# Patient Record
Sex: Female | Born: 1980 | Race: Black or African American | Hispanic: No | Marital: Single | Smoking: Former smoker
Health system: Southern US, Community
[De-identification: ages and names within clinical notes are randomized; demographics above are authoritative.]

## PROBLEM LIST (undated history)

## (undated) DIAGNOSIS — K760 Fatty (change of) liver, not elsewhere classified: Secondary | ICD-10-CM

## (undated) DIAGNOSIS — K8020 Calculus of gallbladder without cholecystitis without obstruction: Secondary | ICD-10-CM

## (undated) HISTORY — DX: Calculus of gallbladder without cholecystitis without obstruction: K80.20

## (undated) HISTORY — DX: Fatty (change of) liver, not elsewhere classified: K76.0

---
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
## 2003-07-05 ENCOUNTER — Encounter: Admission: RE | Admit: 2003-07-05 | Discharge: 2003-07-05 | Payer: Self-pay | Admitting: Family Medicine

## 2005-01-14 ENCOUNTER — Emergency Department (HOSPITAL_COMMUNITY): Admission: EM | Admit: 2005-01-14 | Discharge: 2005-01-14 | Payer: Self-pay | Admitting: Family Medicine

## 2008-09-13 ENCOUNTER — Emergency Department (HOSPITAL_COMMUNITY): Admission: EM | Admit: 2008-09-13 | Discharge: 2008-09-13 | Payer: Self-pay | Admitting: Emergency Medicine

## 2008-09-14 ENCOUNTER — Emergency Department (HOSPITAL_COMMUNITY): Admission: EM | Admit: 2008-09-14 | Discharge: 2008-09-14 | Payer: Self-pay | Admitting: Family Medicine

## 2008-09-25 ENCOUNTER — Emergency Department (HOSPITAL_COMMUNITY): Admission: EM | Admit: 2008-09-25 | Discharge: 2008-09-25 | Payer: Self-pay | Admitting: Family Medicine

## 2010-04-12 LAB — POCT URINALYSIS DIP (DEVICE)
Glucose, UA: NEGATIVE mg/dL
Hgb urine dipstick: NEGATIVE
Nitrite: NEGATIVE
pH: 6.5 (ref 5.0–8.0)

## 2010-04-12 LAB — URINE MICROSCOPIC-ADD ON

## 2010-04-12 LAB — URINALYSIS, ROUTINE W REFLEX MICROSCOPIC
Ketones, ur: NEGATIVE mg/dL
Protein, ur: NEGATIVE mg/dL
Urobilinogen, UA: 2 mg/dL — ABNORMAL HIGH (ref 0.0–1.0)

## 2010-04-12 LAB — GC/CHLAMYDIA PROBE AMP, GENITAL: GC Probe Amp, Genital: POSITIVE — AB

## 2010-04-12 LAB — URINE CULTURE
Colony Count: NO GROWTH
Culture: NO GROWTH

## 2010-04-12 LAB — WET PREP, GENITAL: Trich, Wet Prep: NONE SEEN

## 2013-12-23 ENCOUNTER — Other Ambulatory Visit (INDEPENDENT_AMBULATORY_CARE_PROVIDER_SITE_OTHER): Payer: 59

## 2013-12-23 ENCOUNTER — Encounter: Payer: Self-pay | Admitting: Internal Medicine

## 2013-12-23 ENCOUNTER — Ambulatory Visit (INDEPENDENT_AMBULATORY_CARE_PROVIDER_SITE_OTHER): Payer: 59 | Admitting: Internal Medicine

## 2013-12-23 VITALS — BP 122/84 | HR 91 | Temp 98.5°F | Resp 16 | Ht 64.0 in | Wt 262.2 lb

## 2013-12-23 DIAGNOSIS — Z23 Encounter for immunization: Secondary | ICD-10-CM

## 2013-12-23 DIAGNOSIS — Z720 Tobacco use: Secondary | ICD-10-CM

## 2013-12-23 DIAGNOSIS — Z299 Encounter for prophylactic measures, unspecified: Secondary | ICD-10-CM

## 2013-12-23 DIAGNOSIS — Z418 Encounter for other procedures for purposes other than remedying health state: Secondary | ICD-10-CM

## 2013-12-23 DIAGNOSIS — Z0000 Encounter for general adult medical examination without abnormal findings: Secondary | ICD-10-CM

## 2013-12-23 LAB — BASIC METABOLIC PANEL
BUN: 16 mg/dL (ref 6–23)
CO2: 25 mEq/L (ref 19–32)
Calcium: 9.3 mg/dL (ref 8.4–10.5)
Chloride: 104 mEq/L (ref 96–112)
Creatinine, Ser: 0.8 mg/dL (ref 0.4–1.2)
GFR: 109 mL/min (ref >60.00)
Glucose, Bld: 99 mg/dL (ref 70–99)
POTASSIUM: 4.5 meq/L (ref 3.5–5.1)
SODIUM: 136 meq/L (ref 135–145)

## 2013-12-23 LAB — CBC
HCT: 40.2 % (ref 36.0–46.0)
HEMOGLOBIN: 13 g/dL (ref 12.0–15.0)
MCHC: 32.3 g/dL (ref 30.0–36.0)
MCV: 87.2 fl (ref 78.0–100.0)
PLATELETS: 281 10*3/uL (ref 150.0–400.0)
RBC: 4.61 Mil/uL (ref 3.87–5.11)
RDW: 13.7 % (ref 11.5–15.5)
WBC: 8.5 10*3/uL (ref 4.0–10.5)

## 2013-12-23 LAB — LIPID PANEL
CHOL/HDL RATIO: 3
Cholesterol: 167 mg/dL (ref 0–200)
HDL: 49.3 mg/dL (ref >39.00)
LDL Cholesterol: 103 mg/dL — ABNORMAL HIGH (ref 0–99)
NONHDL: 117.7
Triglycerides: 73 mg/dL (ref 0.0–149.0)
VLDL: 14.6 mg/dL (ref 0.0–40.0)

## 2013-12-23 LAB — HEMOGLOBIN A1C: Hgb A1c MFr Bld: 5.9 % (ref 4.6–6.5)

## 2013-12-23 LAB — HIV ANTIBODY (ROUTINE TESTING W REFLEX): HIV 1&2 Ab, 4th Generation: NONREACTIVE

## 2013-12-23 NOTE — Assessment & Plan Note (Signed)
Spoke with her about the risks and harms of cigarette smoke and talked with her about quitting. Spoke with her about various methods including patch, gum, bupropion and given information about quitting. She is moving from pre-contemplative to preparing herself to quit.

## 2013-12-23 NOTE — Assessment & Plan Note (Signed)
Patient will think about whether she wants to do her pap smear here or with a gyn. She got TDap today. Declines flu shot.

## 2013-12-23 NOTE — Progress Notes (Signed)
Pre visit review using our clinic review tool, if applicable. No additional management support is needed unless otherwise documented below in the visit note. 

## 2013-12-23 NOTE — Patient Instructions (Signed)
We do recommend that you work on getting exercise 3-4 days a week for 20-30 minutes each time.  We will check your blood work today and call you back with the results. We have given you a tetanus shot today.  You are also due for a Pap smear. We can do this at our office or you can find a gynecologist that is in your network to do that.   Quitting smoking is one of the best things that you can do for your health. There are a variety of options to help you quit including patches, gums, prescription medications. We will give you some information about quitting today and if you decide that you want to try a prescription medication to help you quit just call our office..  We will see you back next year for your checkup. If you have any new problems or questions before then please feel free to call our office.  Smoking Cessation Quitting smoking is important to your health and has many advantages. However, it is not always easy to quit since nicotine is a very addictive drug. Oftentimes, people try 3 times or more before being able to quit. This document explains the best ways for you to prepare to quit smoking. Quitting takes hard work and a lot of effort, but you can do it. ADVANTAGES OF QUITTING SMOKING  You will live longer, feel better, and live better.  Your body will feel the impact of quitting smoking almost immediately.  Within 20 minutes, blood pressure decreases. Your pulse returns to its normal level.  After 8 hours, carbon monoxide levels in the blood return to normal. Your oxygen level increases.  After 24 hours, the chance of having a heart attack starts to decrease. Your breath, hair, and body stop smelling like smoke.  After 48 hours, damaged nerve endings begin to recover. Your sense of taste and smell improve.  After 72 hours, the body is virtually free of nicotine. Your bronchial tubes relax and breathing becomes easier.  After 2 to 12 weeks, lungs can hold more air.  Exercise becomes easier and circulation improves.  The risk of having a heart attack, stroke, cancer, or lung disease is greatly reduced.  After 1 year, the risk of coronary heart disease is cut in half.  After 5 years, the risk of stroke falls to the same as a nonsmoker.  After 10 years, the risk of lung cancer is cut in half and the risk of other cancers decreases significantly.  After 15 years, the risk of coronary heart disease drops, usually to the level of a nonsmoker.  If you are pregnant, quitting smoking will improve your chances of having a healthy baby.  The people you live with, especially any children, will be healthier.  You will have extra money to spend on things other than cigarettes. QUESTIONS TO THINK ABOUT BEFORE ATTEMPTING TO QUIT You may want to talk about your answers with your health care provider.  Why do you want to quit?  If you tried to quit in the past, what helped and what did not?  What will be the most difficult situations for you after you quit? How will you plan to handle them?  Who can help you through the tough times? Your family? Friends? A health care provider?  What pleasures do you get from smoking? What ways can you still get pleasure if you quit? Here are some questions to ask your health care provider:  How can you help me  to be successful at quitting?  What medicine do you think would be best for me and how should I take it?  What should I do if I need more help?  What is smoking withdrawal like? How can I get information on withdrawal? GET READY  Set a quit date.  Change your environment by getting rid of all cigarettes, ashtrays, matches, and lighters in your home, car, or work. Do not let people smoke in your home.  Review your past attempts to quit. Think about what worked and what did not. GET SUPPORT AND ENCOURAGEMENT You have a better chance of being successful if you have help. You can get support in many ways.  Tell  your family, friends, and coworkers that you are going to quit and need their support. Ask them not to smoke around you.  Get individual, group, or telephone counseling and support. Programs are available at [employer] and health centers. Call your local health department for information about programs in your area.  Spiritual beliefs and practices may help some smokers quit.  Download a "quit meter" on your computer to keep track of quit statistics, such as how long you have gone without smoking, cigarettes not smoked, and money saved.  Get a self-help book about quitting smoking and staying off tobacco. LEARN NEW SKILLS AND BEHAVIORS  Distract yourself from urges to smoke. Talk to someone, go for a walk, or occupy your time with a task.  Change your normal routine. Take a different route to work. Drink tea instead of coffee. Eat breakfast in a different place.  Reduce your stress. Take a hot bath, exercise, or read a book.  Plan something enjoyable to do every day. Reward yourself for not smoking.  Explore interactive web-based programs that specialize in helping you quit. GET MEDICINE AND USE IT CORRECTLY Medicines can help you stop smoking and decrease the urge to smoke. Combining medicine with the above behavioral methods and support can greatly increase your chances of successfully quitting smoking.  Nicotine replacement therapy helps deliver nicotine to your body without the negative effects and risks of smoking. Nicotine replacement therapy includes nicotine gum, lozenges, inhalers, nasal sprays, and skin patches. Some may be available over-the-counter and others require a prescription.  Antidepressant medicine helps people abstain from smoking, but how this works is unknown. This medicine is available by prescription.  Nicotinic receptor partial agonist medicine simulates the effect of nicotine in your brain. This medicine is available by prescription. Ask your health care  provider for advice about which medicines to use and how to use them based on your health history. Your health care provider will tell you what side effects to look out for if you choose to be on a medicine or therapy. Carefully read the information on the package. Do not use any other product containing nicotine while using a nicotine replacement product.  RELAPSE OR DIFFICULT SITUATIONS Most relapses occur within the first 3 months after quitting. Do not be discouraged if you start smoking again. Remember, most people try several times before finally quitting. You may have symptoms of withdrawal because your body is used to nicotine. You may crave cigarettes, be irritable, feel very hungry, cough often, get headaches, or have difficulty concentrating. The withdrawal symptoms are only temporary. They are strongest when you first quit, but they will go away within 10-14 days. To reduce the chances of relapse, try to:  Avoid drinking alcohol. Drinking lowers your chances of successfully quitting.  Reduce the  amount of caffeine you consume. Once you quit smoking, the amount of caffeine in your body increases and can give you symptoms, such as a rapid heartbeat, sweating, and anxiety.  Avoid smokers because they can make you want to smoke.  Do not let weight gain distract you. Many smokers will gain weight when they quit, usually less than 10 pounds. Eat a healthy diet and stay active. You can always lose the weight gained after you quit.  Find ways to improve your mood other than smoking. FOR MORE INFORMATION  www.smokefree.gov  Document Released: 12/17/2000 Document Revised: 05/09/2013 Document Reviewed: 04/03/2011 [facility] Patient Information 2015 [place] [place]. This information is not intended to replace advice given to you by your health care provider. Make sure you discuss any questions you have with your health care provider.   Bupropion sustained-release tablets (smoking cessation) What  is this medicine? BUPROPION (byoo PROE pee on) is used to help people quit smoking. This medicine may be used for other purposes; ask your health care provider or pharmacist if you have questions. COMMON BRAND NAME(S): Buproban, Zyban How should I use this medicine? Take this medicine by mouth with a glass of water. Follow the directions on the prescription label. You can take it with or without food. If it upsets your stomach, take it with food. Do not cut, crush or chew this medicine. Take your medicine at regular intervals. If you take this medicine more than once a day, take your second dose at least 8 hours after you take your first dose. To limit difficulty in sleeping, avoid taking this medicine at bedtime. Do not take your medicine more often than directed. Do not stop taking this medicine suddenly except upon the advice of your doctor. Stopping this medicine too quickly may cause serious side effects. A special MedGuide will be given to you by the pharmacist with each prescription and refill. Be sure to read this information carefully each time. Talk to your pediatrician regarding the use of this medicine in children. Special care may be needed. Overdosage: If you think you have taken too much of this medicine contact a poison control center or emergency room at once. NOTE: This medicine is only for you. Do not share this medicine with others. What if I miss a dose? If you miss a dose, skip the missed dose and take your next tablet at the regular time. There should be at least 8 hours between doses. Do not take double or extra doses. What may interact with this medicine? Do not take this medicine with any of the following medications: -linezolid -MAOIs like Azilect, Carbex, Eldepryl, Marplan, Nardil, and Parnate -methylene blue (injected into a vein) -other medicines that contain bupropion like Wellbutrin This medicine may also interact with the following medications: -alcohol -certain  medicines for anxiety or sleep -certain medicines for blood pressure like metoprolol, propranolol -certain medicines for depression or psychotic disturbances -certain medicines for HIV or AIDS like efavirenz, lopinavir, nelfinavir, ritonavir -certain medicines for irregular heart beat like propafenone, flecainide -certain medicines for Parkinson's disease like amantadine, levodopa -certain medicines for seizures like carbamazepine, phenytoin, phenobarbital -cimetidine -clopidogrel -cyclophosphamide -furazolidone -isoniazid -nicotine -orphenadrine -procarbazine -steroid medicines like prednisone or cortisone -stimulant medicines for attention disorders, weight loss, or to stay awake -tamoxifen -theophylline -thiotepa -ticlopidine -tramadol -warfarin This list may not describe all possible interactions. Give your health care provider a list of all the medicines, herbs, non-prescription drugs, or dietary supplements you use. Also tell them if you smoke, drink  alcohol, or use illegal drugs. Some items may interact with your medicine. What should I watch for while using this medicine? Visit your doctor or health care professional for regular checks on your progress. This medicine should be used together with a patient support program. It is important to participate in a behavioral program, counseling, or other support program that is recommended by your health care professional. Patients and their families should watch out for new or worsening thoughts of suicide or depression. Also watch out for sudden changes in feelings such as feeling anxious, agitated, panicky, irritable, hostile, aggressive, impulsive, severely restless, overly excited and hyperactive, or not being able to sleep. If this happens, especially at the beginning of treatment or after a change in dose, call your health care professional. Avoid alcoholic drinks while taking this medicine. Drinking excessive alcoholic beverages,  using sleeping or anxiety medicines, or quickly stopping the use of these agents while taking this medicine may increase your risk for a seizure. Do not drive or use heavy machinery until you know how this medicine affects you. This medicine can impair your ability to perform these tasks. Do not take this medicine close to bedtime. It may prevent you from sleeping. Your mouth may get dry. Chewing sugarless gum or sucking hard candy, and drinking plenty of water may help. Contact your doctor if the problem does not go away or is severe. Do not use nicotine patches or chewing gum without the advice of your doctor or health care professional while taking this medicine. You may need to have your blood pressure taken regularly if your doctor recommends that you use both nicotine and this medicine together. What side effects may I notice from receiving this medicine? Side effects that you should report to your doctor or health care professional as soon as possible: -allergic reactions like skin rash, itching or hives, swelling of the face, lips, or tongue -breathing problems -changes in vision -confusion -fast or irregular heartbeat -hallucinations -increased blood pressure -redness, blistering, peeling or loosening of the skin, including inside the mouth -seizures -suicidal thoughts or other mood changes -unusually weak or tired -vomiting Side effects that usually do not require medical attention (report to your doctor or health care professional if they continue or are bothersome): -change in sex drive or performance -constipation -headache -loss of appetite -nausea -tremors -weight loss This list may not describe all possible side effects. Call your doctor for medical advice about side effects. You may report side effects to FDA at 1-800-FDA-1088. Where should I keep my medicine? Keep out of the reach of children. Store at room temperature between 20 and 25 degrees C (68 and 77 degrees F).  Protect from light. Keep container tightly closed. Throw away any unused medicine after the expiration date. NOTE: This sheet is a summary. It may not cover all possible information. If you have questions about this medicine, talk to your doctor, pharmacist, or health care provider.  2015, [employer]. (2012-08-20 10:55:10)

## 2013-12-23 NOTE — Assessment & Plan Note (Signed)
Spoke with her about diet and exercise. Reminded her that being overweight can cause health problems including high blood pressure, diabetes. Advised her that she needs to be exercising at least 3-4 times per week.

## 2013-12-23 NOTE — Progress Notes (Signed)
   Subjective:    Patient ID: Angela Crosby, female    DOB: 1980-01-29, 33 y.o.   MRN: [id number]  HPI  the patient is a 33 year old female who comes in today to establish care. She does have morbid obesity, tobacco abuse. She is currently smoking about half pack per day. She has done so for several years. She states that she did quit at one time however smoking is a social activity at her work and this caused her to restart. She does not have any complaints at this time nor any concerns. She is not really getting any exercise right now except one day week she walks. Her diet is so-so however she states it could be better. She is just concerned since there are several health problems that run in her family she wants to make sure that she does not have these health problems  Review of Systems  Constitutional: Negative for fever, activity change, appetite change, fatigue and unexpected weight change.  HENT: Negative.   Respiratory: Negative for cough, chest tightness, shortness of breath and wheezing.   Cardiovascular: Negative for chest pain, palpitations and leg swelling.  Gastrointestinal: Negative for nausea, abdominal pain, diarrhea, constipation and abdominal distention.  Genitourinary: Negative.   Musculoskeletal: Negative for myalgias, back pain, joint swelling and arthralgias.  Skin: Negative.   Neurological: Negative for dizziness, weakness, light-headedness and headaches.  Psychiatric/Behavioral: Negative.       Objective:   Physical Exam  Constitutional: She is oriented to person, place, and time. She appears well-developed and well-nourished.  Obese  HENT:  Head: Normocephalic and atraumatic.  Eyes: EOM are normal.  Neck: Normal range of motion.  Cardiovascular: Normal rate and regular rhythm.   Pulmonary/Chest: Effort normal and breath sounds normal. No respiratory distress. She has no wheezes. She has no rales.  Abdominal: Soft. Bowel sounds are normal. She exhibits no  distension. There is no tenderness. There is no rebound.  Musculoskeletal: She exhibits no edema.  Neurological: She is alert and oriented to person, place, and time. Coordination normal.  Skin: Skin is warm and dry.   Filed Vitals:   12/23/13 0811  BP: 122/84  Pulse: 91  Temp: 98.5 F (36.9 C)  TempSrc: Oral  Resp: 16  Height: 5\' 4"  (1.626 m)  Weight: 262 lb 3.2 oz (118.933 kg)  SpO2: 98%      Assessment & Plan:

## 2014-03-21 ENCOUNTER — Emergency Department (HOSPITAL_COMMUNITY)
Admission: EM | Admit: 2014-03-21 | Discharge: 2014-03-21 | Disposition: A | Discharge status: Home / Self Care | Payer: 59 | Source: Home / Self Care | Attending: Family Medicine | Admitting: Family Medicine

## 2014-03-21 ENCOUNTER — Encounter (HOSPITAL_COMMUNITY): Payer: Self-pay | Admitting: Emergency Medicine

## 2014-03-21 DIAGNOSIS — L080 Pyoderma: Secondary | ICD-10-CM

## 2014-03-21 MED ORDER — CLINDAMYCIN PHOS-BENZOYL PEROX 1-5 % EX GEL: Freq: Two times a day (BID) | CUTANEOUS | Status: DC

## 2014-03-21 MED ORDER — MINOCYCLINE HCL 100 MG PO CAPS: 100.0000 mg | ORAL_CAPSULE | Freq: Two times a day (BID) | ORAL | Status: DC

## 2014-03-21 NOTE — ED Notes (Addendum)
"  flu - like" symptoms.  Generalized soreness, chills, cough, congested, green/white phlegm.  Onset Thursday 3/10

## 2014-03-21 NOTE — Discharge Instructions (Signed)
Wash face vigorously  tnen apply gel and take medicine as prescribed, see your doctor if further problems

## 2014-03-21 NOTE — ED Provider Notes (Signed)
CSN: [id number]     Arrival date & time 03/21/14  1949 History   First MD Initiated Contact with Patient 03/21/14 2058     Chief Complaint  Patient presents with  . URI   (Consider location/radiation/quality/duration/timing/severity/associated sxs/prior Treatment) Patient is a 34 y.o. female presenting with rash. The history is provided by the patient.  Rash Location:  Head/neck Head/neck rash location:  R neck and L neck Quality: painful and redness   Pain details:    Onset quality:  Gradual   Duration:  3 days   Progression:  Worsening Severity:  Mild Chronicity:  New Context comment:  Bilat lower facial pustular rash Relieved by:  None tried Worsened by:  Nothing tried Ineffective treatments:  None tried Associated symptoms: no fever     History reviewed. No pertinent past medical history. History reviewed. No pertinent past surgical history. Family History  Problem Relation Age of Onset  . Hypertension Father   . Diabetes Maternal Grandmother   . Diabetes Paternal Grandfather    History  Substance Use Topics  . Smoking status: Current Every Day Smoker  . Smokeless tobacco: Not on file  . Alcohol Use: 0.0 oz/week    0 Standard drinks or equivalent per week     Comment: three times a month   OB History    No data available     Review of Systems  Constitutional: Negative.  Negative for fever.  Skin: Positive for rash.    Allergies  Review of patient's allergies indicates no known allergies.  Home Medications   Prior to Admission medications   Medication Sig Start Date End Date Taking? Authorizing Provider  Chlorphen-Pseudoephed-APAP [facility](THERAFLU FLU/COLD PO) Take by mouth.   Yes Historical Provider, MD  clindamycin-benzoyl peroxide (BENZACLIN) gel Apply topically 2 (two) times daily. 03/21/14   Linna HoffJames D Sabrin Dunlevy, MD  minocycline (MINOCIN,DYNACIN) 100 MG capsule Take 1 capsule (100 mg total) by mouth 2 (two) times daily. 03/21/14   Linna HoffJames D Deldrick Linch, MD   BP 144/88  mmHg  Pulse 95  Temp(Src) 99.1 F (37.3 C) (Oral)  Resp 18  SpO2 100%  LMP 03/18/2014 Physical Exam  Constitutional: She is oriented to person, place, and time. She appears well-developed and well-nourished. No distress.  HENT:  Mouth/Throat: Oropharynx is clear and moist.  Eyes: Pupils are equal, round, and reactive to light.  Neck: Normal range of motion. Neck supple.  Pulmonary/Chest: Effort normal and breath sounds normal.  Musculoskeletal: Normal range of motion.  Lymphadenopathy:    She has no cervical adenopathy.  Neurological: She is alert and oriented to person, place, and time.  Skin: Skin is warm and dry. Rash noted.  bilat pustular lower facial rash acneiform.  Nursing note and vitals reviewed.   ED Course  Procedures (including critical care time) Labs Review Labs Reviewed - No data to display  Imaging Review No results found.   MDM   1. Pustular rash        Linna HoffJames D Bryer Cozzolino, MD 03/21/14 2132

## 2014-08-27 ENCOUNTER — Ambulatory Visit (INDEPENDENT_AMBULATORY_CARE_PROVIDER_SITE_OTHER): Payer: 59

## 2014-08-27 ENCOUNTER — Ambulatory Visit (INDEPENDENT_AMBULATORY_CARE_PROVIDER_SITE_OTHER): Payer: 59 | Admitting: Internal Medicine

## 2014-08-27 VITALS — BP 138/90 | HR 105 | Temp 99.5°F | Resp 18 | Ht 64.0 in | Wt 265.0 lb

## 2014-08-27 DIAGNOSIS — R059 Cough, unspecified: Secondary | ICD-10-CM

## 2014-08-27 DIAGNOSIS — R5383 Other fatigue: Secondary | ICD-10-CM | POA: Diagnosis not present

## 2014-08-27 DIAGNOSIS — J029 Acute pharyngitis, unspecified: Secondary | ICD-10-CM | POA: Diagnosis not present

## 2014-08-27 DIAGNOSIS — R05 Cough: Secondary | ICD-10-CM

## 2014-08-27 LAB — POCT CBC
Granulocyte percent: 72.5 %G (ref 37–80)
HEMATOCRIT: 41.3 % (ref 37.7–47.9)
Hemoglobin: 12.9 g/dL (ref 12.2–16.2)
LYMPH, POC: 2.2 (ref 0.6–3.4)
MCH, POC: 26.5 pg — AB (ref 27–31.2)
MCHC: 31.3 g/dL — AB (ref 31.8–35.4)
MCV: 84.8 fL (ref 80–97)
MID (cbc): 0.1 (ref 0–0.9)
MPV: 7.2 fL (ref 0–99.8)
POC GRANULOCYTE: 6.2 (ref 2–6.9)
POC LYMPH %: 25.8 % (ref 10–50)
POC MID %: 1.7 % (ref 0–12)
Platelet Count, POC: 288 10*3/uL (ref 142–424)
RBC: 4.88 M/uL (ref 4.04–5.48)
RDW, POC: 13.8 %
WBC: 8.5 10*3/uL (ref 4.6–10.2)

## 2014-08-27 LAB — POCT RAPID STREP A (OFFICE): RAPID STREP A SCREEN: NEGATIVE

## 2014-08-27 MED ORDER — HYDROCODONE-HOMATROPINE 5-1.5 MG/5ML PO SYRP: 5.0000 mL | ORAL_SOLUTION | Freq: Four times a day (QID) | ORAL | Status: DC | PRN

## 2014-08-27 MED ORDER — AZITHROMYCIN 250 MG PO TABS: ORAL_TABLET | ORAL | Status: DC

## 2014-08-27 NOTE — Progress Notes (Signed)
Subjective:  This chart was scribed for Angela Sia, MD by Angela Crosby, medical scribe at [facility].The patient was seen in exam room 03 and the patient's care was started at 8:40 AM.   Patient ID: Angela Crosby, female    DOB: 1980/02/01, 34 y.o.   MRN: [id number] Chief Complaint  Patient presents with  . Sore Throat    x3 weeks    HPI HPI Comments: Angela Crosby is a 34 y.o. female who presents to [facility] complaining of a sore throat for three weeks, with a productive cough and ear pain. Fatigue since the illness has started. Pain with swallowing foods or liquids. The cough aggravates the throat. No history of mono. No sinus allergies. She has not had a fever, trouble breathing when laying down, night sweats, skin changes, or a change in her appetite. Trouble sleeping for sometime, but she does not snore, and does not wake in the middle of the night short of breath has wakes felling refreshed. She works in Clinical biochemist.  History reviewed. No pertinent past medical history. Prior to Admission medications   Medication Sig Start Date End Date Taking? Authorizing Provider  Chlorphen-Pseudoephed-APAP [facility] FLU/COLD PO) Take by mouth.    Historical Provider, MD  clindamycin-benzoyl peroxide (BENZACLIN) gel Apply topically 2 (two) times daily. Patient not taking: Reported on 08/27/2014 03/21/14   Linna Hoff, MD  minocycline (MINOCIN,DYNACIN) 100 MG capsule Take 1 capsule (100 mg total) by mouth 2 (two) times daily. Patient not taking: Reported on 08/27/2014 03/21/14   Linna Hoff, MD   No Known Allergies Review of Systems  Constitutional: Positive for fatigue. Negative for fever, diaphoresis and appetite change.  HENT: Positive for ear pain, sore throat and trouble swallowing. Negative for postnasal drip, rhinorrhea and sinus pressure.   Eyes: Negative for itching.  Respiratory: Positive for cough. Negative for choking, shortness  of breath and wheezing.   Cardiovascular: Negative for palpitations.  Skin: Negative for rash.  Psychiatric/Behavioral: Positive for sleep disturbance.      Objective:  BP 138/90 mmHg  Pulse 105  Temp(Src) 99.5 F (37.5 C) (Oral)  Resp 18  Ht  (1.626 m)  Wt 265 lb (120.203 kg)  BMI 45.46 kg/m2  SpO2 96%  LMP 08/07/2014 Physical Exam  Constitutional: She is oriented to person, place, and time. She appears well-developed and well-nourished. No distress.  HENT:  Head: Normocephalic and atraumatic.  Right Ear: External ear normal.  Left Ear: External ear normal.  thr sl red but no exud  Eyes: Pupils are equal, round, and reactive to light.  Neck: Normal range of motion.  No ac nodes, pc nodes  Cardiovascular: Normal rate, regular rhythm and normal heart sounds.   No murmur heard. Pulmonary/Chest: Effort normal. No respiratory distress.  Scattered rhonchi clear w/ cough Distant BS No wheez w/ forc expir  Musculoskeletal: Normal range of motion.  Neurological: She is alert and oriented to person, place, and time.  Skin: Skin is warm and dry.  Psychiatric: She has a normal mood and affect. Her behavior is normal.  Nursing note and vitals reviewed. [facility] reading (PRIMARY) by  Dr. Manpreet Strey=elevated hemidiaphr L, other wise clear. Results for orders placed or performed in visit on 08/27/14  POCT CBC  Result Value Ref Range   WBC 8.5 4.6 - 10.2 K/uL   Lymph, poc 2.2 0.6 - 3.4   POC LYMPH PERCENT 25.8 10 - 50 %L  MID (cbc) 0.1 0 - 0.9   POC MID % 1.7 0 - 12 %M   POC Granulocyte 6.2 2 - 6.9   Granulocyte percent 72.5 37 - 80 %G   RBC 4.88 4.04 - 5.48 M/uL   Hemoglobin 12.9 12.2 - 16.2 g/dL   HCT, POC 16.1 09.6 - 47.9 %   MCV 84.8 80 - 97 fL   MCH, POC 26.5 (A) 27 - 31.2 pg   MCHC 31.3 (A) 31.8 - 35.4 g/dL   RDW, POC 04.5 %   Platelet Count, POC 288 142 - 424 K/uL   MPV 7.2 0 - 99.8 fL  POCT rapid strep A  Result Value Ref Range   Rapid Strep A Screen Negative  Negative        Assessment & Plan:  Cough - prolonged  Sore throat - persistant  Other fatigue - prob 2 to disrupted sleep fr cough  Treat to cover mycoplasma: Meds ordered this encounter  Medications  . azithromycin (ZITHROMAX) 250 MG tablet    Sig: As packaged    Dispense:  6 tablet    Refill:  0  . HYDROcodone-homatropine (HYCODAN) 5-1.5 MG/5ML syrup    Sig: Take 5 mLs by mouth every 6 (six) hours as needed.    Dispense:  120 mL    Refill:  0      I have completed the patient encounter in its entirety as documented by the scribe, with editing by me where necessary. Angela Crosby P. Angela Crosby, M.D.

## 2018-10-22 ENCOUNTER — Telehealth: Payer: Self-pay | Admitting: General Practice

## 2018-10-22 ENCOUNTER — Other Ambulatory Visit: Payer: Self-pay

## 2018-10-22 ENCOUNTER — Ambulatory Visit: Payer: Self-pay | Admitting: Nurse Practitioner

## 2018-10-22 NOTE — Telephone Encounter (Signed)
Pt report of vomiting after ate pot pie and vomit 2 hr later, pt also report taking sea moss--thinking diarrhea might be coming from this med. Pt report body as in painful on right side on certain movements.   Per [employer] visit today. Pt is aware.

## 2018-10-22 NOTE — Telephone Encounter (Signed)
Patient returned call to do prescreening for today's appointment. Patient answered yes to muscle pain, vomiting and diarrhea while doing the screening. Patient informed the nurse will call her back to see if she can still be seen in office.

## 2020-06-08 ENCOUNTER — Other Ambulatory Visit: Payer: Self-pay

## 2020-06-08 ENCOUNTER — Ambulatory Visit (INDEPENDENT_AMBULATORY_CARE_PROVIDER_SITE_OTHER): Payer: No Typology Code available for payment source | Admitting: Internal Medicine

## 2020-06-08 ENCOUNTER — Encounter: Payer: Self-pay | Admitting: Internal Medicine

## 2020-06-08 VITALS — BP 128/74 | HR 97 | Temp 98.7°F | Resp 18 | Ht 64.0 in | Wt 263.0 lb

## 2020-06-08 DIAGNOSIS — Z0000 Encounter for general adult medical examination without abnormal findings: Secondary | ICD-10-CM | POA: Diagnosis not present

## 2020-06-08 DIAGNOSIS — Z720 Tobacco use: Secondary | ICD-10-CM | POA: Diagnosis not present

## 2020-06-08 DIAGNOSIS — R1011 Right upper quadrant pain: Secondary | ICD-10-CM

## 2020-06-08 LAB — TSH: TSH: 1.17 u[IU]/mL (ref 0.35–4.50)

## 2020-06-08 LAB — CBC
HCT: 39.7 % (ref 36.0–46.0)
Hemoglobin: 13 g/dL (ref 12.0–15.0)
MCHC: 32.8 g/dL (ref 30.0–36.0)
MCV: 87.8 fl (ref 78.0–100.0)
Platelets: 277 10*3/uL (ref 150.0–400.0)
RBC: 4.52 Mil/uL (ref 3.87–5.11)
RDW: 13.8 % (ref 11.5–15.5)
WBC: 7 10*3/uL (ref 4.0–10.5)

## 2020-06-08 LAB — VITAMIN B12: Vitamin B-12: 475 pg/mL (ref 211–911)

## 2020-06-08 LAB — HEMOGLOBIN A1C: Hgb A1c MFr Bld: 5.8 % (ref 4.6–6.5)

## 2020-06-08 LAB — VITAMIN D 25 HYDROXY (VIT D DEFICIENCY, FRACTURES): VITD: <7 ng/mL — ABNORMAL LOW (ref 30.00–100.00)

## 2020-06-08 NOTE — Assessment & Plan Note (Addendum)
Checking CMP, CBC, lipase, TSH, vitamin D and B12. Adjust as needed. If no source will order US abdomen to assess.

## 2020-06-08 NOTE — Progress Notes (Addendum)
   Subjective:   Patient ID: Angela Crosby, female    DOB: May 04, 1980, 40 y.o.   MRN: [id number]  HPI The patient is a new 40 YO female coming in for RUQ pain (intermittent and lasts 1-2 days when present, does not take anything as not severe, more noticeable around 1 week prior to periods, does have heavy periods and has long term).  Wants physical also.  PMH, [facility], social history reviewed and updated  Review of Systems  Constitutional: Negative.   HENT: Negative.   Eyes: Negative.   Respiratory: Negative for cough, chest tightness and shortness of breath.   Cardiovascular: Negative for chest pain, palpitations and leg swelling.  Gastrointestinal: Positive for abdominal pain. Negative for abdominal distention, constipation, diarrhea, nausea and vomiting.  Musculoskeletal: Negative.   Skin: Negative.   Neurological: Negative.   Psychiatric/Behavioral: Negative.     Objective:  Physical Exam Constitutional:      Appearance: She is well-developed. She is obese.  HENT:     Head: Normocephalic and atraumatic.  Cardiovascular:     Rate and Rhythm: Normal rate and regular rhythm.  Pulmonary:     Effort: Pulmonary effort is normal. No respiratory distress.     Breath sounds: Normal breath sounds. No wheezing or rales.  Abdominal:     General: Bowel sounds are normal. There is no distension.     Palpations: Abdomen is soft.     Tenderness: There is no abdominal tenderness. There is no rebound.  Musculoskeletal:     Cervical back: Normal range of motion.  Skin:    General: Skin is warm and dry.  Neurological:     Mental Status: She is alert and oriented to person, place, and time.     Coordination: Coordination normal.     Vitals:   06/08/20 0808  BP: 128/74  Pulse: 97  Resp: 18  Temp: 98.7 F (37.1 C)  TempSrc: Oral  SpO2: 99%  Weight: 263 lb (119.3 kg)  Height: 5\' 4"  (1.626 m)    This visit occurred during the SARS-CoV-2 public health emergency.  Safety protocols  were in place, including screening questions prior to the visit, additional usage of staff PPE, and extensive cleaning of exam room while observing appropriate contact time as indicated for disinfecting solutions.   Assessment & Plan:

## 2020-06-08 NOTE — Patient Instructions (Addendum)
Vaccines.gov to get the booster for covid-19. We will get you in for the pap smear with an ob/gyn doctor.   Health Maintenance, Female Adopting a healthy lifestyle and getting preventive care are important in promoting health and wellness. Ask your health care provider about:  The right schedule for you to have regular tests and exams.  Things you can do on your own to prevent diseases and keep yourself healthy. What should I know about diet, weight, and exercise? Eat a healthy diet  Eat a diet that includes plenty of vegetables, fruits, low-fat dairy products, and lean protein.  Do not eat a lot of foods that are high in solid fats, added sugars, or sodium.   Maintain a healthy weight Body mass index (BMI) is used to identify weight problems. It estimates body fat based on height and weight. Your health care provider can help determine your BMI and help you achieve or maintain a healthy weight. Get regular exercise Get regular exercise. This is one of the most important things you can do for your health. Most adults should:  Exercise for at least 150 minutes each week. The exercise should increase your heart rate and make you sweat (moderate-intensity exercise).  Do strengthening exercises at least twice a week. This is in addition to the moderate-intensity exercise.  Spend less time sitting. Even light physical activity can be beneficial. Watch cholesterol and blood lipids Have your blood tested for lipids and cholesterol at 38 years of age, then have this test every 5 years. Have your cholesterol levels checked more often if:  Your lipid or cholesterol levels are high.  You are older than 38 years of age.  You are at high risk for heart disease. What should I know about cancer screening? Depending on your health history and family history, you may need to have cancer screening at various ages. This may include screening for:  Breast cancer.  Cervical cancer.  Colorectal  cancer.  Skin cancer.  Lung cancer. What should I know about heart disease, diabetes, and high blood pressure? Blood pressure and heart disease  High blood pressure causes heart disease and increases the risk of stroke. This is more likely to develop in people who have high blood pressure readings, are of African descent, or are overweight.  Have your blood pressure checked: ? Every 3-5 years if you are 33-74 years of age. ? Every year if you are 15 years old or older. Diabetes Have regular diabetes screenings. This checks your fasting blood sugar level. Have the screening done:  Once every three years after age 28 if you are at a normal weight and have a low risk for diabetes.  More often and at a younger age if you are overweight or have a high risk for diabetes. What should I know about preventing infection? Hepatitis B If you have a higher risk for hepatitis B, you should be screened for this virus. Talk with your health care provider to find out if you are at risk for hepatitis B infection. Hepatitis C Testing is recommended for:  Everyone born from [birth year] through 1963.  Anyone with known risk factors for hepatitis C. Sexually transmitted infections (STIs)  Get screened for STIs, including gonorrhea and chlamydia, if: ? You are sexually active and are younger than 38 years of age. ? You are older than 38 years of age and your health care provider tells you that you are at risk for this type of infection. ? Your sexual activity  has changed since you were last screened, and you are at increased risk for chlamydia or gonorrhea. Ask your health care provider if you are at risk.  Ask your health care provider about whether you are at high risk for HIV. Your health care provider may recommend a prescription medicine to help prevent HIV infection. If you choose to take medicine to prevent HIV, you should first get tested for HIV. You should then be tested every 3 months for as long as  you are taking the medicine. Pregnancy  If you are about to stop having your period (premenopausal) and you may become pregnant, seek counseling before you get pregnant.  Take 400 to 800 micrograms (mcg) of folic acid every day if you become pregnant.  Ask for birth control (contraception) if you want to prevent pregnancy. Osteoporosis and menopause Osteoporosis is a disease in which the bones lose minerals and strength with aging. This can result in bone fractures. If you are 1 years old or older, or if you are at risk for osteoporosis and fractures, ask your health care provider if you should:  Be screened for bone loss.  Take a calcium or vitamin D supplement to lower your risk of fractures.  Be given hormone replacement therapy (HRT) to treat symptoms of menopause. Follow these instructions at home: Lifestyle  Do not use any products that contain nicotine or tobacco, such as cigarettes, e-cigarettes, and chewing tobacco. If you need help quitting, ask your health care provider.  Do not use street drugs.  Do not share needles.  Ask your health care provider for help if you need support or information about quitting drugs. Alcohol use  Do not drink alcohol if: ? Your health care provider tells you not to drink. ? You are pregnant, may be pregnant, or are planning to become pregnant.  If you drink alcohol: ? Limit how much you use to 0-1 drink a day. ? Limit intake if you are breastfeeding.  Be aware of how much alcohol is in your drink. In the [place] one drink equals one 12 oz bottle of beer (355 mL), one 5 oz glass of wine (148 mL), or one 1 oz glass of hard liquor (44 mL). General instructions  Schedule regular health, dental, and eye exams.  Stay current with your vaccines.  Tell your health care provider if: ? You often feel depressed. ? You have ever been abused or do not feel safe at home. Summary  Adopting a healthy lifestyle and getting preventive care are  important in promoting health and wellness.  Follow your health care provider's instructions about healthy diet, exercising, and getting tested or screened for diseases.  Follow your health care provider's instructions on monitoring your cholesterol and blood pressure. This information is not intended to replace advice given to you by your health care provider. Make sure you discuss any questions you have with your health care provider. Document Revised: 12/16/2017 Document Reviewed: 12/16/2017 Elsevier Patient Education  2021 [employer].

## 2020-06-08 NOTE — Assessment & Plan Note (Signed)
Not exercising currently. Checking HgA1c to assess for complications there is strong family history of diabetes. Counseled about diet and exercise changes.

## 2020-06-08 NOTE — Assessment & Plan Note (Signed)
Counseled on health risk and harm from smoking. Counseled to quit and she is not sure about making an attempt currently.

## 2020-06-08 NOTE — Assessment & Plan Note (Signed)
Flu shot yearly. Covid-19 2 shots counseled about booster. Tetanus up to date. Pap smear referral to ob/gyn. Counseled about sun safety and mole surveillance. Counseled about the dangers of distracted driving. Given 10 year screening recommendations.

## 2020-06-11 ENCOUNTER — Other Ambulatory Visit: Payer: Self-pay | Admitting: Internal Medicine

## 2020-06-11 LAB — COMPREHENSIVE METABOLIC PANEL
ALT: 14 U/L (ref 0–35)
AST: 23 U/L (ref 0–37)
Albumin: 4.2 g/dL (ref 3.5–5.2)
Alkaline Phosphatase: 57 U/L (ref 39–117)
BUN: 14 mg/dL (ref 6–23)
CO2: 25 mEq/L (ref 19–32)
Calcium: 9.1 mg/dL (ref 8.4–10.5)
Chloride: 104 mEq/L (ref 96–112)
Creatinine, Ser: 0.74 mg/dL (ref 0.40–1.20)
GFR: 103.15 mL/min (ref >60.00)
Glucose, Bld: 92 mg/dL (ref 70–99)
Potassium: 4 mEq/L (ref 3.5–5.1)
Sodium: 138 mEq/L (ref 135–145)
Total Bilirubin: 0.5 mg/dL (ref 0.2–1.2)
Total Protein: 7.4 g/dL (ref 6.0–8.3)

## 2020-06-11 LAB — LIPID PANEL
Cholesterol: 173 mg/dL (ref 0–200)
HDL: 48.2 mg/dL (ref >39.00)
LDL Cholesterol: 104 mg/dL — ABNORMAL HIGH (ref 0–99)
NonHDL: 124.6
Total CHOL/HDL Ratio: 4
Triglycerides: 104 mg/dL (ref 0.0–149.0)
VLDL: 20.8 mg/dL (ref 0.0–40.0)

## 2020-06-11 LAB — LIPASE: Lipase: 17 U/L (ref 11.0–59.0)

## 2020-06-11 MED ORDER — VITAMIN D (ERGOCALCIFEROL) 1.25 MG (50000 UNIT) PO CAPS: 50000.0000 [IU] | ORAL_CAPSULE | ORAL | 0 refills | Status: DC

## 2020-09-13 ENCOUNTER — Emergency Department (HOSPITAL_BASED_OUTPATIENT_CLINIC_OR_DEPARTMENT_OTHER): Payer: No Typology Code available for payment source

## 2020-09-13 ENCOUNTER — Emergency Department (HOSPITAL_BASED_OUTPATIENT_CLINIC_OR_DEPARTMENT_OTHER)
Admission: EM | Admit: 2020-09-13 | Discharge: 2020-09-13 | Disposition: A | Discharge status: Home / Self Care | Payer: No Typology Code available for payment source | Attending: Emergency Medicine | Admitting: Emergency Medicine

## 2020-09-13 ENCOUNTER — Other Ambulatory Visit: Payer: Self-pay

## 2020-09-13 ENCOUNTER — Encounter (HOSPITAL_BASED_OUTPATIENT_CLINIC_OR_DEPARTMENT_OTHER): Payer: Self-pay

## 2020-09-13 DIAGNOSIS — J069 Acute upper respiratory infection, unspecified: Secondary | ICD-10-CM | POA: Insufficient documentation

## 2020-09-13 DIAGNOSIS — J029 Acute pharyngitis, unspecified: Secondary | ICD-10-CM | POA: Diagnosis present

## 2020-09-13 DIAGNOSIS — Z87891 Personal history of nicotine dependence: Secondary | ICD-10-CM | POA: Insufficient documentation

## 2020-09-13 DIAGNOSIS — R059 Cough, unspecified: Secondary | ICD-10-CM

## 2020-09-13 DIAGNOSIS — Z20822 Contact with and (suspected) exposure to covid-19: Secondary | ICD-10-CM | POA: Insufficient documentation

## 2020-09-13 LAB — RESP PANEL BY RT-PCR (FLU A&B, COVID) ARPGX2
Influenza A by PCR: NEGATIVE
Influenza B by PCR: NEGATIVE
SARS Coronavirus 2 by RT PCR: NEGATIVE

## 2020-09-13 MED ORDER — AEROCHAMBER PLUS FLO-VU LARGE MISC
1.0000 | Freq: Once | Status: AC
  Administered 2020-09-13: 1
  Filled 2020-09-13: qty 1

## 2020-09-13 MED ORDER — ALBUTEROL SULFATE HFA 108 (90 BASE) MCG/ACT IN AERS
1.0000 | INHALATION_SPRAY | Freq: Once | RESPIRATORY_TRACT | Status: AC
  Administered 2020-09-13: 2 via RESPIRATORY_TRACT
  Filled 2020-09-13: qty 6.7

## 2020-09-13 NOTE — Discharge Instructions (Addendum)
You have been seen in the ED for upper respiratory symptoms. Your COVID and flu test returned negative. Your chest XR showed no signs of pneumonia. You have been discharged home with an albuterol inhaler. Please use this if you develop worsening breathing or feel wheezy.   Read the instructions below on reasons to return to the emergency department and to learn more about your diagnosis.  Use over the counter medications for symptomatic relief as we discussed (musinex as a decongestant, Tylenol for fever/pain, Motrin/Ibuprofen for muscle aches). If prescribed a cough suppressant during your visit, do not operate heavy machinery with in 5 hours of taking this medication. Followup with your primary care doctor in 4 days if your symptoms persist.  Your more than welcome to return to the emergency department if symptoms worsen or become concerning.  Upper Respiratory Infection, Adult  An upper respiratory infection (URI) is also sometimes known as the common cold. Most people improve within 1 week, but symptoms can last up to 2 weeks. A residual cough may last even longer.   URI is most commonly caused by a virus. Viruses are NOT treated with antibiotics. You can easily spread the virus to others by oral contact. This includes kissing, sharing a glass, coughing, or sneezing. Touching your mouth or nose and then touching a surface, which is then touched by another person, can also spread the virus.   TREATMENT  Treatment is directed at relieving symptoms. There is no cure. Antibiotics are not effective, because the infection is caused by a virus, not by bacteria. Treatment may include:  Increased fluid intake. Sports drinks offer valuable electrolytes, sugars, and fluids.  Breathing heated mist or steam (vaporizer or shower).  Eating chicken soup or other clear broths, and maintaining good nutrition.  Getting plenty of rest.  Using gargles or lozenges for comfort.  Controlling fevers with ibuprofen or  acetaminophen as directed by your caregiver.  Increasing usage of your inhaler if you have asthma.  Return to work when your temperature has returned to normal.   SEEK MEDICAL CARE IF:  After the first few days, you feel you are getting worse rather than better.  You develop worsening shortness of breath, or brown or red sputum. These may be signs of pneumonia.  You develop yellow or brown nasal discharge or pain in the face, especially when you bend forward. These may be signs of sinusitis.  You develop a fever, swollen neck glands, pain with swallowing, or white areas in the back of your throat. These may be signs of strep throat.

## 2020-09-13 NOTE — ED Provider Notes (Signed)
[facility] EMERGENCY DEPARTMENT Provider Note   CSN: [id number] Arrival date & time: 09/13/20  1458     History Chief Complaint  Patient presents with   Cough    Angela Crosby is a 40 y.o. female.  Past medical history of obesity.  ED with 2 days of flulike symptoms.  She says she had a sore throat and rhinorrhea that started on Tuesday.  Her symptoms have worsened and is been more difficult to breathe.  She also has a cough that is productive with no blood noted.  She has myalgias.  She says she has chest tightness with deep breaths.  She denies any fever or chills.  She denies being around anyone else that has been sick.  Denies abdominal pain, nausea, vomiting, diarrhea, headache.   Cough Associated symptoms: myalgias, rhinorrhea, sore throat and wheezing   Associated symptoms: no chest pain, no chills, no fever, no headaches, no rash and no shortness of breath       History reviewed. No pertinent past medical history.  Patient Active Problem List   Diagnosis Date Noted   RUQ pain 06/08/2020   Morbid obesity (HCC) 12/23/2013   Tobacco abuse 12/23/2013   Routine general medical examination at a health care facility 12/23/2013    History reviewed. No pertinent surgical history.   OB History   No obstetric history on file.     Family History  Problem Relation Age of Onset   Hypertension Father    Diabetes Maternal Grandmother    Diabetes Paternal Grandfather     Social History   Tobacco Use   Smoking status: Former    Types: Cigarettes   Smokeless tobacco: Never  Vaping Use   Vaping Use: Never used  Substance Use Topics   Alcohol use: Yes    Alcohol/week: 1.0 standard drink    Types: 1 Standard drinks or equivalent per week    Comment: occ   Drug use: No    Home Medications Prior to Admission medications   Medication Sig Start Date End Date Taking? Authorizing Provider  Vitamin D, Ergocalciferol, (DRISDOL) 1.25 MG (50000 UNIT) CAPS  capsule Take 1 capsule (50,000 Units total) by mouth every 7 (seven) days. 06/11/20   Myrlene Broker, MD    Allergies    Patient has no known allergies.  Review of Systems   Review of Systems  Constitutional:  Negative for chills and fever.  HENT:  Positive for congestion, rhinorrhea and sore throat.   Eyes:  Negative for visual disturbance.  Respiratory:  Positive for cough, chest tightness and wheezing. Negative for shortness of breath.   Cardiovascular:  Negative for chest pain, palpitations and leg swelling.  Gastrointestinal:  Negative for abdominal pain, constipation, diarrhea, nausea and vomiting.  Genitourinary:  Negative for difficulty urinating.  Musculoskeletal:  Positive for myalgias. Negative for back pain.  Skin:  Negative for rash and wound.  Neurological:  Negative for dizziness, syncope, weakness, light-headedness and headaches.  All other systems reviewed and are negative.  Physical Exam Updated Vital Signs BP (!) 144/88 (BP Location: Left Arm)   Pulse 96   Temp 99.2 F (37.3 C) (Oral)   Resp 20   Ht 5\' 4"  (1.626 m)   Wt 111.1 kg   LMP 08/22/2020   SpO2 100%   BMI 42.05 kg/m   Physical Exam Vitals and nursing note reviewed.  Constitutional:      General: She is not in acute distress.    Appearance: Normal  appearance. She is not ill-appearing, toxic-appearing or diaphoretic.  HENT:     Head: Normocephalic and atraumatic.     Mouth/Throat:     Mouth: Mucous membranes are moist.     Pharynx: Oropharynx is clear. No oropharyngeal exudate or posterior oropharyngeal erythema.  Eyes:     General: No scleral icterus.       Right eye: No discharge.        Left eye: No discharge.     Conjunctiva/sclera: Conjunctivae normal.  Cardiovascular:     Rate and Rhythm: Normal rate and regular rhythm.     Pulses: Normal pulses.     Heart sounds: Normal heart sounds, S1 normal and S2 normal. No murmur heard.   No friction rub. No gallop.  Pulmonary:      Effort: Pulmonary effort is normal. No respiratory distress.     Breath sounds: Wheezing present. No rhonchi or rales.     Comments: Inspiratory wheezes bilaterally. Abdominal:     Palpations: There is no pulsatile mass.  Musculoskeletal:     Right lower leg: No edema.     Left lower leg: No edema.  Skin:    General: Skin is warm and dry.     Coloration: Skin is not jaundiced.     Findings: No bruising, erythema, lesion or rash.  Neurological:     General: No focal deficit present.     Mental Status: She is alert and oriented to person, place, and time.  Psychiatric:        Mood and Affect: Mood normal.        Behavior: Behavior normal.    ED Results / Procedures / Treatments   Labs (all labs ordered are listed, but only abnormal results are displayed) Labs Reviewed  RESP PANEL BY RT-PCR (FLU A&B, COVID) [id number]    EKG None  Radiology DG Chest Port 1 View  Result Date: 09/13/2020 CLINICAL DATA:  Flu-like symptoms. EXAM: PORTABLE CHEST 1 VIEW COMPARISON:  August 27, 2014. FINDINGS: The heart size and mediastinal contours are within normal limits. Both lungs are clear. The visualized skeletal structures are unremarkable. IMPRESSION: No active disease. Electronically Signed   By: Lupita Raider M.D.   On: 09/13/2020 15:59    Procedures Procedures   Medications Ordered in ED Medications  albuterol (VENTOLIN HFA) 108 (90 Base) MCG/ACT inhaler 1-2 puff (2 puffs Inhalation Given 09/13/20 1614)  AeroChamber Plus Flo-Vu Large MISC 1 each (1 each Other Given 09/13/20 1615)    ED Course  I have reviewed the triage vital signs and the nursing notes.  Pertinent labs & imaging results that were available during my care of the patient were reviewed by me and considered in my medical decision making (see chart for details).    MDM Rules/Calculators/A&P                           This is a well-appearing 40 year old female who presents with 2 days of flulike symptoms.  She says that  she has had worsening breathing.  She appears to be in no acute distress.  Her vital signs are overall stable.  She is afebrile.  She is oxygenating at 100% on room air.  Exam notable for bilateral inspiratory wheezing.  She has an upper respiratory virus.  COVID and flu testing.  Will obtain chest x-ray given abnormal lung sounds.  Will give albuterol inhaler while in ED.  COVID and flu negative.  Chest  x-ray with no signs of pneumonia.  Wheezing has improved after albuterol inhaler.  Patient likely has an upper respiratory infection or acute bronchitis.  She she can use the albuterol inhaler as needed at home when she feels like she is having difficulty breathing.  She is told to return if her symptoms do not start improving within 6 days antibiotics at this time.  Vitals are stable prior to discharge.  She is medically stable for discharge..  Final Clinical Impression(s) / ED Diagnoses Final diagnoses:  Viral URI with cough    Rx / DC Orders ED Discharge Orders     None        Claudie Leach, PA-C 09/13/20 1623    Koleen Distance, MD 09/14/20 [phone]

## 2020-09-13 NOTE — ED Triage Notes (Signed)
Pt c/o flu like sx x 2 days-NAD-steady gait 

## 2021-01-05 ENCOUNTER — Other Ambulatory Visit: Payer: Self-pay

## 2021-01-05 ENCOUNTER — Emergency Department (HOSPITAL_BASED_OUTPATIENT_CLINIC_OR_DEPARTMENT_OTHER): Payer: No Typology Code available for payment source

## 2021-01-05 ENCOUNTER — Emergency Department (HOSPITAL_BASED_OUTPATIENT_CLINIC_OR_DEPARTMENT_OTHER)
Admission: EM | Admit: 2021-01-05 | Discharge: 2021-01-05 | Disposition: A | Discharge status: Home / Self Care | Payer: No Typology Code available for payment source | Attending: Emergency Medicine | Admitting: Emergency Medicine

## 2021-01-05 ENCOUNTER — Encounter (HOSPITAL_BASED_OUTPATIENT_CLINIC_OR_DEPARTMENT_OTHER): Payer: Self-pay | Admitting: Emergency Medicine

## 2021-01-05 DIAGNOSIS — R110 Nausea: Secondary | ICD-10-CM | POA: Diagnosis not present

## 2021-01-05 DIAGNOSIS — R42 Dizziness and giddiness: Secondary | ICD-10-CM | POA: Insufficient documentation

## 2021-01-05 DIAGNOSIS — R5383 Other fatigue: Secondary | ICD-10-CM | POA: Diagnosis not present

## 2021-01-05 DIAGNOSIS — G4700 Insomnia, unspecified: Secondary | ICD-10-CM | POA: Insufficient documentation

## 2021-01-05 DIAGNOSIS — R519 Headache, unspecified: Secondary | ICD-10-CM | POA: Diagnosis not present

## 2021-01-05 DIAGNOSIS — Z87891 Personal history of nicotine dependence: Secondary | ICD-10-CM | POA: Insufficient documentation

## 2021-01-05 LAB — CBC WITH DIFFERENTIAL/PLATELET
Abs Immature Granulocytes: 0.04 10*3/uL (ref 0.00–0.07)
Basophils Absolute: 0 10*3/uL (ref 0.0–0.1)
Basophils Relative: 0 %
Eosinophils Absolute: 0.1 10*3/uL (ref 0.0–0.5)
Eosinophils Relative: 1 %
HCT: 40.5 % (ref 36.0–46.0)
Hemoglobin: 13.2 g/dL (ref 12.0–15.0)
Immature Granulocytes: 0 %
Lymphocytes Relative: 32 %
Lymphs Abs: 3.1 10*3/uL (ref 0.7–4.0)
MCH: 28.8 pg (ref 26.0–34.0)
MCHC: 32.6 g/dL (ref 30.0–36.0)
MCV: 88.4 fL (ref 80.0–100.0)
Monocytes Absolute: 0.6 10*3/uL (ref 0.1–1.0)
Monocytes Relative: 7 %
Neutro Abs: 5.7 10*3/uL (ref 1.7–7.7)
Neutrophils Relative %: 60 %
Platelets: 274 10*3/uL (ref 150–400)
RBC: 4.58 MIL/uL (ref 3.87–5.11)
RDW: 13.9 % (ref 11.5–15.5)
WBC: 9.6 10*3/uL (ref 4.0–10.5)
nRBC: 0 % (ref 0.0–0.2)

## 2021-01-05 LAB — BASIC METABOLIC PANEL
Anion gap: 8 (ref 5–15)
BUN: 15 mg/dL (ref 6–20)
CO2: 25 mmol/L (ref 22–32)
Calcium: 9.1 mg/dL (ref 8.9–10.3)
Chloride: 102 mmol/L (ref 98–111)
Creatinine, Ser: 0.7 mg/dL (ref 0.44–1.00)
GFR, Estimated: >60 mL/min (ref >60)
Glucose, Bld: 92 mg/dL (ref 70–99)
Potassium: 3.9 mmol/L (ref 3.5–5.1)
Sodium: 135 mmol/L (ref 135–145)

## 2021-01-05 LAB — PREGNANCY, URINE: Preg Test, Ur: NEGATIVE

## 2021-01-05 MED ORDER — MECLIZINE HCL 25 MG PO TABS: 12.5000 mg | ORAL_TABLET | Freq: Three times a day (TID) | ORAL | 0 refills | Status: DC | PRN

## 2021-01-05 NOTE — ED Triage Notes (Signed)
Pt arrives pov with c/o dizziness since Oct, endorses possibility r/t new eye glass prescription. Pt denies CP, endorses HA, and nausea. PT AOx4, bilaterally equal

## 2021-01-05 NOTE — ED Notes (Signed)
Pt states she is concerned for pregnancy. Specimen cup given and RN made aware of pts request for a pregnancy test.

## 2021-01-05 NOTE — Discharge Instructions (Signed)

## 2021-01-05 NOTE — ED Notes (Addendum)
Correcting charting meant for a different patient
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
## 2021-10-15 ENCOUNTER — Encounter: Payer: Self-pay | Admitting: Internal Medicine

## 2021-10-15 ENCOUNTER — Ambulatory Visit (INDEPENDENT_AMBULATORY_CARE_PROVIDER_SITE_OTHER): Payer: No Typology Code available for payment source

## 2021-10-15 ENCOUNTER — Ambulatory Visit (INDEPENDENT_AMBULATORY_CARE_PROVIDER_SITE_OTHER): Payer: No Typology Code available for payment source | Admitting: Internal Medicine

## 2021-10-15 VITALS — BP 120/80 | HR 84 | Temp 98.4°F | Ht 64.0 in | Wt 256.4 lb

## 2021-10-15 DIAGNOSIS — M25512 Pain in left shoulder: Secondary | ICD-10-CM | POA: Diagnosis not present

## 2021-10-15 MED ORDER — MELOXICAM 15 MG PO TABS: 15.0000 mg | ORAL_TABLET | Freq: Every day | ORAL | 0 refills | Status: DC

## 2021-10-15 MED ORDER — ALBUTEROL SULFATE HFA 108 (90 BASE) MCG/ACT IN AERS: 2.0000 | INHALATION_SPRAY | Freq: Four times a day (QID) | RESPIRATORY_TRACT | 2 refills | Status: DC | PRN

## 2021-10-15 NOTE — Patient Instructions (Addendum)
We have sent in meloxicam which is the anti-inflammatory to take 1 pill daily. It is okay to take tylenol with this. Do not take additional ibuprofen or aleve/naproxen with it.
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
## 2021-10-18 DIAGNOSIS — M25512 Pain in left shoulder: Secondary | ICD-10-CM | POA: Insufficient documentation
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
## 2022-07-17 ENCOUNTER — Telehealth: Payer: No Typology Code available for payment source | Admitting: Physician Assistant

## 2022-07-17 DIAGNOSIS — H109 Unspecified conjunctivitis: Secondary | ICD-10-CM | POA: Diagnosis not present

## 2022-07-17 MED ORDER — POLYMYXIN B-TRIMETHOPRIM 10000-0.1 UNIT/ML-% OP SOLN: 1.0000 [drp] | OPHTHALMIC | 0 refills | Status: DC

## 2022-07-17 NOTE — Progress Notes (Signed)
Virtual Visit Consent   Angela Crosby, you are scheduled for a virtual visit with a [facility] provider today. Just as with appointments in the office, your consent must be obtained to participate. Your consent will be active for this visit and any virtual visit you may have with one of our providers in the next 365 days. If you have a MyChart account, a copy of this consent can be sent to you electronically.  As this is a virtual visit, video technology does not allow for your provider to perform a traditional examination. This may limit your provider's ability to fully assess your condition. If your provider identifies any concerns that need to be evaluated in person or the need to arrange testing (such as labs, EKG, etc.), we will make arrangements to do so. Although advances in technology are sophisticated, we cannot ensure that it will always work on either your end or our end. If the connection with a video visit is poor, the visit may have to be switched to a telephone visit. With either a video or telephone visit, we are not always able to ensure that we have a secure connection.  By engaging in this virtual visit, you consent to the provision of healthcare and authorize for your insurance to be billed (if applicable) for the services provided during this visit. Depending on your insurance coverage, you may receive a charge related to this service.  I need to obtain your verbal consent now. Are you willing to proceed with your visit today? ASTELLA DESIR has provided verbal consent on 07/17/2022 for a virtual visit (video or telephone). Margaretann Loveless, PA-C  Date: 07/17/2022 6:04 PM  Virtual Visit via Video Note   I, Margaretann Loveless, connected with  Angela Crosby  ([id number], 06-27-1980) on 07/17/22 at  6:00 PM EDT by a video-enabled telemedicine application and verified that I am speaking with the correct person using two identifiers.  Location: Patient: Virtual Visit Location  Patient: Home Provider: Virtual Visit Location Provider: Home Office   I discussed the limitations of evaluation and management by telemedicine and the availability of in person appointments. The patient expressed understanding and agreed to proceed.    History of Present Illness: Angela Crosby is a 42 y.o. who identifies as a female who was assigned female at birth, and is being seen today for possible pink eye.  HPI: Conjunctivitis  The current episode started 3 to 5 days ago (3 days). The onset was sudden. The problem occurs continuously. The problem has been unchanged. The problem is mild. Nothing relieves the symptoms. Nothing aggravates the symptoms. Associated symptoms include eye itching, eye discharge and eye redness. Pertinent negatives include no fever, no decreased vision, no double vision, no photophobia, no congestion, no ear discharge, no URI and no eye pain. The eye pain is mild. The right eye is affected. The eye pain is not associated with movement. The eyelid exhibits swelling.     Problems:  Patient Active Problem List   Diagnosis Date Noted   Acute pain of left shoulder 10/18/2021   RUQ pain 06/08/2020   Morbid obesity (HCC) 12/23/2013   Tobacco abuse 12/23/2013   Routine general medical examination at a health care facility 12/23/2013    Allergies: No Known Allergies Medications:  Current Outpatient Medications:    albuterol (VENTOLIN HFA) 108 (90 Base) MCG/ACT inhaler, Inhale 2 puffs into the lungs every 6 (six) hours as needed for wheezing or shortness of breath., Disp:  8 g, Rfl: 2   meclizine (ANTIVERT) 25 MG tablet, Take 0.5-1 tablets (12.5-25 mg total) by mouth 3 (three) times daily as needed for dizziness or nausea., Disp: 30 tablet, Rfl: 0   meloxicam (MOBIC) 15 MG tablet, Take 1 tablet (15 mg total) by mouth daily., Disp: 30 tablet, Rfl: 0   trimethoprim-polymyxin b (POLYTRIM) ophthalmic solution, Place 1 drop into the right eye every 4 (four) hours. X 5  days, Disp: 10 mL, Rfl: 0   Vitamin D, Ergocalciferol, (DRISDOL) 1.25 MG (50000 UNIT) CAPS capsule, Take 1 capsule (50,000 Units total) by mouth every 7 (seven) days., Disp: 12 capsule, Rfl: 0  Observations/Objective: Patient is well-developed, well-nourished in no acute distress.  Resting comfortably at home.  Head is normocephalic, atraumatic.  No labored breathing.  Speech is clear and coherent with logical content.  Patient is alert and oriented at baseline.    Assessment and Plan: 1. Bacterial conjunctivitis of right eye - trimethoprim-polymyxin b (POLYTRIM) ophthalmic solution; Place 1 drop into the right eye every 4 (four) hours. X 5 days  Dispense: 10 mL; Refill: 0  - Suspect bacterial conjunctivitis - Polytrim prescribed - Warm compresses - Good hand hygiene - Seek in person evaluation if symptoms worsen or fail to improve   Follow Up Instructions: I discussed the assessment and treatment plan with the patient. The patient was provided an opportunity to ask questions and all were answered. The patient agreed with the plan and demonstrated an understanding of the instructions.  A copy of instructions were sent to the patient via MyChart unless otherwise noted below.    The patient was advised to call back or seek an in-person evaluation if the symptoms worsen or if the condition fails to improve as anticipated.  Time:  I spent 8 minutes with the patient via telehealth technology discussing the above problems/concerns.    Margaretann Loveless, PA-C

## 2022-07-17 NOTE — Patient Instructions (Signed)
Angela Crosby, thank you for joining Margaretann Loveless, PA-C for today's virtual visit.  While this provider is not your primary care provider (PCP), if your PCP is located in our provider database this encounter information will be shared with them immediately following your visit.   A [facility] MyChart account gives you access to today's visit and all your visits, tests, and labs performed at [facility] " click here if you don't have a [facility] MyChart account or go to [email or web address]  Consent: (Patient) Angela Crosby provided verbal consent for this virtual visit at the beginning of the encounter.  Current Medications:  Current Outpatient Medications:    albuterol (VENTOLIN HFA) 108 (90 Base) MCG/ACT inhaler, Inhale 2 puffs into the lungs every 6 (six) hours as needed for wheezing or shortness of breath., Disp: 8 g, Rfl: 2   meclizine (ANTIVERT) 25 MG tablet, Take 0.5-1 tablets (12.5-25 mg total) by mouth 3 (three) times daily as needed for dizziness or nausea., Disp: 30 tablet, Rfl: 0   meloxicam (MOBIC) 15 MG tablet, Take 1 tablet (15 mg total) by mouth daily., Disp: 30 tablet, Rfl: 0   trimethoprim-polymyxin b (POLYTRIM) ophthalmic solution, Place 1 drop into the right eye every 4 (four) hours. X 5 days, Disp: 10 mL, Rfl: 0   Vitamin D, Ergocalciferol, (DRISDOL) 1.25 MG (50000 UNIT) CAPS capsule, Take 1 capsule (50,000 Units total) by mouth every 7 (seven) days., Disp: 12 capsule, Rfl: 0   Medications ordered in this encounter:  Meds ordered this encounter  Medications   trimethoprim-polymyxin b (POLYTRIM) ophthalmic solution    Sig: Place 1 drop into the right eye every 4 (four) hours. X 5 days    Dispense:  10 mL    Refill:  0    Order Specific Question:   Supervising Provider    Answer:   Merrilee Jansky [[id number]]     *If you need refills on other medications prior to your next appointment, please contact your pharmacy*  Follow-Up: Call back  or seek an in-person evaluation if the symptoms worsen or if the condition fails to improve as anticipated.  [facility] [phone]  Other Instructions Bacterial Conjunctivitis, Adult Bacterial conjunctivitis is an infection of the clear membrane that covers the white part of the eye and the inner surface of the eyelid (conjunctiva). When the blood vessels in the conjunctiva become inflamed, the eye becomes red or pink. The eye often feels irritated or itchy. Bacterial conjunctivitis spreads easily from person to person (is contagious). It also spreads easily from one eye to the other eye. What are the causes? This condition is caused by bacteria. You may get the infection if you come into close contact with: A person who is infected with the bacteria. Items that are contaminated with the bacteria, such as a face towel, contact lens solution, or eye makeup. What increases the risk? You are more likely to develop this condition if: You are exposed to other people who have the infection. You wear contact lenses. You have a sinus infection. You have had a recent eye injury or surgery. You have a weak body defense system (immune system). You have a medical condition that causes dry eyes. What are the signs or symptoms? Symptoms of this condition include: Thick, yellowish discharge from the eye. This may turn into a crust on the eyelid overnight and cause your eyelids to stick together. Tearing or watery eyes. Itchy eyes. Burning feeling in your  eyes. Eye redness. Swollen eyelids. Blurred vision. How is this diagnosed? This condition is diagnosed based on your symptoms and medical history. Your health care provider may also take a sample of discharge from your eye to find the cause of your infection. How is this treated? This condition may be treated with: Antibiotic eye drops or ointment to clear the infection more quickly and prevent the spread of infection to  others. Antibiotic medicines taken by mouth (orally) to treat infections that do not respond to drops or ointments or that last longer than 10 days. Cool, wet cloths (cool compresses) placed on the eyes. Artificial tears applied 2-6 times a day. Follow these instructions at home: Medicines Take or apply your antibiotic medicine as told by your health care provider. Do not stop using the antibiotic, even if your condition improves, unless directed by your health care provider. Take or apply over-the-counter and prescription medicines only as told by your health care provider. Be very careful to avoid touching the edge of your eyelid with the eye-drop bottle or the ointment tube when you apply medicines to the affected eye. This will keep you from spreading the infection to your other eye or to other people. Managing discomfort Gently wipe away any drainage from your eye with a warm, wet washcloth or a cotton ball. Apply a clean, cool compress to your eye for 10-20 minutes, 3-4 times a day. General instructions Do not wear contact lenses until the inflammation is gone and your health care provider says it is safe to wear them again. Ask your health care provider how to sterilize or replace your contact lenses before you use them again. Wear glasses until you can resume wearing contact lenses. Avoid wearing eye makeup until the inflammation is gone. Throw away any old eye cosmetics that may be contaminated. Change or wash your pillowcase every day. Do not share towels or washcloths. This may spread the infection. Wash your hands often with soap and water for at least 20 seconds and especially before touching your face or eyes. Use paper towels to dry your hands. Avoid touching or rubbing your eyes. Do not drive or use heavy machinery if your vision is blurred. Contact a health care provider if: You have a fever. Your symptoms do not get better after 10 days. Get help right away if: You have a  fever and your symptoms suddenly get worse. You have severe pain when you move your eye. You have facial pain, redness, or swelling. You have a sudden loss of vision. Summary Bacterial conjunctivitis is an infection of the clear membrane that covers the white part of the eye and the inner surface of the eyelid (conjunctiva). Bacterial conjunctivitis spreads easily from eye to eye and from person to person (is contagious). Wash your hands often with soap and water for at least 20 seconds and especially before touching your face or eyes. Use paper towels to dry your hands. Take or apply your antibiotic medicine as told by your health care provider. Do not stop using the antibiotic even if your condition improves. Contact a health care provider if you have a fever or if your symptoms do not get better after 10 days. Get help right away if you have a sudden loss of vision. This information is not intended to replace advice given to you by your health care provider. Make sure you discuss any questions you have with your health care provider. Document Revised: 04/04/2020 Document Reviewed: 04/04/2020 Elsevier Patient Education  2024 [employer]    If you have been instructed to have an in-person evaluation today at a local Urgent Care facility, please use the link below. It will take you to a list of all of our available [facility], including address, phone number and hours of operation. Please do not delay care.  [facility]  If you or a family member do not have a primary care provider, use the link below to schedule a visit and establish care. When you choose a [facility] primary care physician or advanced practice provider, you gain a long-term partner in health. Find a Primary Care Provider  Learn more about [facility]'s in-office and virtual care options:  - Get Care Now

## 2022-09-07 ENCOUNTER — Emergency Department (HOSPITAL_COMMUNITY)
Admission: EM | Admit: 2022-09-07 | Discharge: 2022-09-07 | Disposition: A | Discharge status: Home / Self Care | Payer: No Typology Code available for payment source | Attending: Emergency Medicine | Admitting: Emergency Medicine

## 2022-09-07 ENCOUNTER — Other Ambulatory Visit: Payer: Self-pay

## 2022-09-07 DIAGNOSIS — I10 Essential (primary) hypertension: Secondary | ICD-10-CM | POA: Insufficient documentation

## 2022-09-07 DIAGNOSIS — R8289 Other abnormal findings on cytological and histological examination of urine: Secondary | ICD-10-CM | POA: Diagnosis not present

## 2022-09-07 DIAGNOSIS — R1110 Vomiting, unspecified: Secondary | ICD-10-CM | POA: Diagnosis not present

## 2022-09-07 DIAGNOSIS — R109 Unspecified abdominal pain: Secondary | ICD-10-CM | POA: Insufficient documentation

## 2022-09-07 DIAGNOSIS — R197 Diarrhea, unspecified: Secondary | ICD-10-CM | POA: Insufficient documentation

## 2022-09-07 LAB — COMPREHENSIVE METABOLIC PANEL
ALT: 18 U/L (ref 0–44)
AST: 14 U/L — ABNORMAL LOW (ref 15–41)
Albumin: 4.2 g/dL (ref 3.5–5.0)
Alkaline Phosphatase: 60 U/L (ref 38–126)
Anion gap: 7 (ref 5–15)
BUN: 17 mg/dL (ref 6–20)
CO2: 25 mmol/L (ref 22–32)
Calcium: 9.2 mg/dL (ref 8.9–10.3)
Chloride: 103 mmol/L (ref 98–111)
Creatinine, Ser: 0.82 mg/dL (ref 0.44–1.00)
GFR, Estimated: >60 mL/min (ref >60)
Glucose, Bld: 99 mg/dL (ref 70–99)
Potassium: 3.9 mmol/L (ref 3.5–5.1)
Sodium: 135 mmol/L (ref 135–145)
Total Bilirubin: 0.8 mg/dL (ref 0.3–1.2)
Total Protein: 7.9 g/dL (ref 6.5–8.1)

## 2022-09-07 LAB — URINALYSIS, ROUTINE W REFLEX MICROSCOPIC
Bacteria, UA: NONE SEEN
Bilirubin Urine: NEGATIVE
Glucose, UA: NEGATIVE mg/dL
Hgb urine dipstick: NEGATIVE
Ketones, ur: NEGATIVE mg/dL
Nitrite: NEGATIVE
Protein, ur: NEGATIVE mg/dL
Specific Gravity, Urine: 1.025 (ref 1.005–1.030)
pH: 5 (ref 5.0–8.0)

## 2022-09-07 LAB — CBC
HCT: 41.4 % (ref 36.0–46.0)
Hemoglobin: 13.4 g/dL (ref 12.0–15.0)
MCH: 29.5 pg (ref 26.0–34.0)
MCHC: 32.4 g/dL (ref 30.0–36.0)
MCV: 91.2 fL (ref 80.0–100.0)
Platelets: 250 10*3/uL (ref 150–400)
RBC: 4.54 MIL/uL (ref 3.87–5.11)
RDW: 14.5 % (ref 11.5–15.5)
WBC: 7 10*3/uL (ref 4.0–10.5)
nRBC: 0 % (ref 0.0–0.2)

## 2022-09-07 LAB — HCG, SERUM, QUALITATIVE: Preg, Serum: NEGATIVE

## 2022-09-07 NOTE — ED Provider Notes (Signed)
[facility] AT [facility] Provider Note   CSN: [id number] Arrival date & time: 09/07/22  1478     History  Chief Complaint  Patient presents with   Flank Pain    Angela Crosby is a 42 y.o. female with no significant past medical history who presents the emergency department complaining of right-sided flank pain for the past 2 weeks.  Patient states that her pain is worse with certain movements.  She is also been having diarrhea for few days.  States this happens anytime she eats something.  Denies any fever.  Sometimes the flank pain wraps around her abdomen.  Did vomit once, but states that she thought this was because of something that she ate.  No urinary symptoms, vaginal bleeding or discharge.   Flank Pain Associated symptoms include abdominal pain.       Home Medications Prior to Admission medications   Medication Sig Start Date End Date Taking? Authorizing Provider  albuterol (VENTOLIN HFA) 108 (90 Base) MCG/ACT inhaler Inhale 2 puffs into the lungs every 6 (six) hours as needed for wheezing or shortness of breath. 10/15/21   Myrlene Broker, MD  meclizine (ANTIVERT) 25 MG tablet Take 0.5-1 tablets (12.5-25 mg total) by mouth 3 (three) times daily as needed for dizziness or nausea. 01/05/21   Arthor Captain, PA-C  meloxicam (MOBIC) 15 MG tablet Take 1 tablet (15 mg total) by mouth daily. 10/15/21   Myrlene Broker, MD  trimethoprim-polymyxin b [facility]) ophthalmic solution Place 1 drop into the right eye every 4 (four) hours. X 5 days 07/17/22   Margaretann Loveless, PA-C  Vitamin D, Ergocalciferol, (DRISDOL) 1.25 MG (50000 UNIT) CAPS capsule Take 1 capsule (50,000 Units total) by mouth every 7 (seven) days. 06/11/20   Myrlene Broker, MD      Allergies    Patient has no known allergies.    Review of Systems   Review of Systems  Gastrointestinal:  Positive for abdominal pain and diarrhea.  Genitourinary:  Positive for  flank pain.  All other systems reviewed and are negative.   Physical Exam Updated Vital Signs BP (!) 155/85 (BP Location: Right Arm)   Pulse (!) 101   Temp 98.6 F (37 C) (Oral)   Resp 18   Ht 5\' 4"  (1.626 m)   Wt 113.4 kg   SpO2 99%   BMI 42.91 kg/m  Physical Exam Vitals and nursing note reviewed.  Constitutional:      Appearance: Normal appearance.  HENT:     Head: Normocephalic and atraumatic.  Eyes:     Conjunctiva/sclera: Conjunctivae normal.  Cardiovascular:     Rate and Rhythm: Normal rate and regular rhythm.  Pulmonary:     Effort: Pulmonary effort is normal. No respiratory distress.     Breath sounds: Normal breath sounds.  Abdominal:     General: There is no distension.     Palpations: Abdomen is soft.     Tenderness: There is no abdominal tenderness.  Skin:    General: Skin is warm and dry.  Neurological:     General: No focal deficit present.     Mental Status: She is alert.     ED Results / Procedures / Treatments   Labs (all labs ordered are listed, but only abnormal results are displayed) Labs Reviewed  URINALYSIS, ROUTINE W REFLEX MICROSCOPIC - Abnormal; Notable for the following components:      Result Value   APPearance HAZY (*)  Leukocytes,Ua SMALL (*)    All other components within normal limits  COMPREHENSIVE METABOLIC PANEL - Abnormal; Notable for the following components:   AST 14 (*)    All other components within normal limits  HCG, SERUM, QUALITATIVE  CBC    EKG None  Radiology No results found.  Procedures Procedures    Medications Ordered in ED Medications - No data to display  ED Course/ Medical Decision Making/ A&P                                 Medical Decision Making Amount and/or Complexity of Data Reviewed Labs: ordered.   This patient is a 42 y.o. female  who presents to the ED for concern of right flank pain.   Differential diagnoses prior to evaluation: The emergent differential diagnosis  includes, but is not limited to,  AAA, renal artery/vein embolism/thrombosis, mesenteric ischemia, pyelonephritis, nephrolithiasis, cystitis, biliary colic, pancreatitis, perforated peptic ulcer, appendicitis, diverticulitis, bowel obstruction, Ectopic Pregnancy, PID/TOA, Ovarian cyst, Ovarian torsion. This is not an exhaustive differential.   Past Medical History / Co-morbidities / Social History: No past medical history on file.  Physical Exam: Physical exam performed. The pertinent findings include: Hypertensive, otherwise normal vital signs.  No acute distress.  Lying comfortably in exam bed.  No abdominal or CVA tenderness to palpation.  Lab Tests/Imaging studies: I personally interpreted labs/imaging and the pertinent results include: CBC and CMP unremarkable.  Urinalysis with small leukocytes, however no bacteria seen, and less than 10 WBCs.  Negative pregnancy..   Patient has a benign abdominal exam and reassuring laboratory evaluation, will defer emergent abdominal imaging at this time.  Disposition: After consideration of the diagnostic results and the patients response to treatment, I feel that emergency department workup does not suggest an emergent condition requiring admission or immediate intervention beyond what has been performed at this time. The plan is: Discharge to home with symptomatic management of nonspecific abdominal and flank pain.  Suspect there could be a component of reflux or biliary colic, however patient's clinically very well-appearing, and has reassuring laboratory evaluation.  Exam unremarkable, do not feel her symptoms are related to emergent pathology such as cholecystitis.  Will recommend PPI, NSAIDs for pain, and Imodium for diarrhea.  She has a follow-up appointment with her PCP later this week.  Given close return precautions.. The patient is safe for discharge and has been instructed to return immediately for worsening symptoms, change in symptoms or any other  concerns.  Final Clinical Impression(s) / ED Diagnoses Final diagnoses:  Right flank pain    Rx / DC Orders ED Discharge Orders     None      Portions of this report may have been transcribed using voice recognition software. Every effort was made to ensure accuracy; however, inadvertent computerized transcription errors may be present.    Jeanella Flattery 09/07/22 1049    Alvira Monday, MD 09/08/22 1119

## 2022-09-07 NOTE — ED Triage Notes (Signed)
Pt arrived via POV. C/o R flank pain that began 2x weeks ago, pain worse with certain movements.   AOX4

## 2022-09-07 NOTE — ED Notes (Signed)
Pt discharged. Discharge information discussed.  No s/s of distress observed during discharge.

## 2022-09-07 NOTE — Discharge Instructions (Addendum)
You were seen in the emerged department today for flank pain.  As we discussed your blood work and urine sample looked very reassuring.  We discussed that it is possible you are having some discomfort related to your gallbladder, however I do not see any evidence requiring imaging today.  I recommend taking an acid reducing medicine such as Pepcid or Prilosec, and over-the-counter medicine such as Imodium to help with your diarrhea.  You can take ibuprofen and/or Tylenol as needed for pain.  Continue to monitor how you're doing and return to the ER for new or worsening symptoms.

## 2022-09-09 ENCOUNTER — Telehealth: Payer: Self-pay

## 2022-09-09 NOTE — Transitions of Care (Post Inpatient/ED Visit) (Signed)
   09/09/2022  Name: Angela Crosby MRN: [id number] DOB: Sep 08, 1980  Today's TOC FU Call Status: Today's TOC FU Call Status:: Successful TOC FU Call Completed TOC FU Call Complete Date: 09/09/22 Patient's Name and Date of Birth confirmed.  Transition Care Management Follow-up Telephone Call Date of Discharge: 09/07/22 Discharge Facility: [facility]) Type of Discharge: Emergency Department Reason for ED Visit: Other: (abdominal pain) How have you been since you were released from the hospital?: Better Any questions or concerns?: No  Items Reviewed: Did you receive and understand the discharge instructions provided?: Yes Medications obtained,verified, and reconciled?: Yes (Medications Reviewed) Any new allergies since your discharge?: No Dietary orders reviewed?: NA Do you have support at home?: Yes  Medications Reviewed Today: Medications Reviewed Today     Reviewed by Barb Merino, LPN (Licensed Practical Nurse) on 09/09/22 at 1537  Med List Status: <None>   Medication Order Taking? Sig Documenting Provider Last Dose Status Informant  albuterol (VENTOLIN HFA) 108 (90 Base) MCG/ACT inhaler [id number]  Inhale 2 puffs into the lungs every 6 (six) hours as needed for wheezing or shortness of breath. Myrlene Broker, MD  Active   meclizine (ANTIVERT) 25 MG tablet [id number] No Take 0.5-1 tablets (12.5-25 mg total) by mouth 3 (three) times daily as needed for dizziness or nausea. Arthor Captain, PA-C Taking Active   meloxicam (MOBIC) 15 MG tablet [id number]  Take 1 tablet (15 mg total) by mouth daily. Myrlene Broker, MD  Active   trimethoprim-polymyxin b [facility]) ophthalmic solution [id number]  Place 1 drop into the right eye every 4 (four) hours. X 5 days Joycelyn Man M, PA-C  Active   Vitamin D, Ergocalciferol, (DRISDOL) 1.25 MG (50000 UNIT) CAPS capsule [id number] No Take 1 capsule (50,000 Units total) by mouth every 7 (seven) days. Myrlene Broker, MD  Taking Active             Home Care and Equipment/Supplies: Were Home Health Services Ordered?: NA Any new equipment or medical supplies ordered?: NA  Functional Questionnaire: Do you need assistance with bathing/showering or dressing?: No Do you need assistance with meal preparation?: No Do you need assistance with eating?: No Do you have difficulty maintaining continence: No Do you need assistance with getting out of bed/getting out of a chair/moving?: No Do you have difficulty managing or taking your medications?: No  Follow up appointments reviewed: PCP Follow-up appointment confirmed?: Yes Date of PCP follow-up appointment?: 09/11/22 Follow-up Provider: Dr. Okey Dupre Specialist [facility] Follow-up appointment confirmed?: NA Do you need transportation to your follow-up appointment?: No Do you understand care options if your condition(s) worsen?: Yes-patient verbalized understanding    SIGNATURE Elisha Ponder LPN [facility] AWV Team Direct dial:  [phone]

## 2022-09-11 ENCOUNTER — Ambulatory Visit (INDEPENDENT_AMBULATORY_CARE_PROVIDER_SITE_OTHER): Payer: No Typology Code available for payment source | Admitting: Internal Medicine

## 2022-09-11 VITALS — BP 144/80 | HR 118 | Temp 98.6°F | Ht 64.0 in | Wt 255.0 lb

## 2022-09-11 DIAGNOSIS — R1011 Right upper quadrant pain: Secondary | ICD-10-CM

## 2022-09-11 DIAGNOSIS — R5383 Other fatigue: Secondary | ICD-10-CM | POA: Diagnosis not present

## 2022-09-11 DIAGNOSIS — Z124 Encounter for screening for malignant neoplasm of cervix: Secondary | ICD-10-CM

## 2022-09-11 DIAGNOSIS — E559 Vitamin D deficiency, unspecified: Secondary | ICD-10-CM | POA: Diagnosis not present

## 2022-09-11 LAB — HEMOGLOBIN A1C: Hgb A1c MFr Bld: 5.7 % (ref 4.6–6.5)

## 2022-09-11 LAB — LIPID PANEL
Cholesterol: 176 mg/dL (ref 0–200)
HDL: 56.6 mg/dL (ref >39.00)
LDL Cholesterol: 101 mg/dL — ABNORMAL HIGH (ref 0–99)
NonHDL: 119.48
Total CHOL/HDL Ratio: 3
Triglycerides: 94 mg/dL (ref 0.0–149.0)
VLDL: 18.8 mg/dL (ref 0.0–40.0)

## 2022-09-11 LAB — VITAMIN B12: Vitamin B-12: 460 pg/mL (ref 211–911)

## 2022-09-11 LAB — TSH: TSH: 0.61 u[IU]/mL (ref 0.35–5.50)

## 2022-09-11 LAB — VITAMIN D 25 HYDROXY (VIT D DEFICIENCY, FRACTURES): VITD: 11.59 ng/mL — ABNORMAL LOW (ref 30.00–100.00)

## 2022-09-11 MED ORDER — PANTOPRAZOLE SODIUM 40 MG PO TBEC: 40.0000 mg | DELAYED_RELEASE_TABLET | Freq: Every day | ORAL | 0 refills | Status: AC

## 2022-09-11 MED ORDER — ALBUTEROL SULFATE HFA 108 (90 BASE) MCG/ACT IN AERS: 2.0000 | INHALATION_SPRAY | Freq: Four times a day (QID) | RESPIRATORY_TRACT | 2 refills | Status: AC | PRN

## 2022-09-11 NOTE — Assessment & Plan Note (Signed)
Pain after eating lasts for 20 minutes. Suspicious for gallbladder and order RUQ [place] today. Rx protonix to take for 2 weeks to see if there is a component of gastritis that may be flaring this.

## 2022-09-11 NOTE — Assessment & Plan Note (Signed)
BMI stable and she is desiring to take medication. Talked about wegovy and zepbound and she is to reach out to her insurance to see if these are covered and if not covered will change to alternative.

## 2022-09-11 NOTE — Progress Notes (Signed)
   Subjective:   Patient ID: Angela Crosby, female    DOB: February 22, 1980, 42 y.o.   MRN: [id number]  Chest Pain  Associated symptoms include abdominal pain and back pain. Pertinent negatives include no cough, nausea, palpitations, shortness of breath or vomiting.  Back Pain Associated symptoms include abdominal pain. Pertinent negatives include no chest pain.   The patient is a 42 YO female coming in for low back pain and 1 day of chest wall pain which has since resolved. Back started about 2 weeks ago. Also has concerns about fatigue and weight.   Review of Systems  Constitutional:  Positive for fatigue.  HENT: Negative.    Eyes: Negative.   Respiratory:  Negative for cough, chest tightness and shortness of breath.   Cardiovascular:  Negative for chest pain, palpitations and leg swelling.  Gastrointestinal:  Positive for abdominal pain. Negative for abdominal distention, constipation, diarrhea, nausea and vomiting.  Musculoskeletal:  Positive for back pain.  Skin: Negative.   Neurological: Negative.   Psychiatric/Behavioral: Negative.      Objective:  Physical Exam Constitutional:      Appearance: She is well-developed. She is obese.  HENT:     Head: Normocephalic and atraumatic.  Cardiovascular:     Rate and Rhythm: Normal rate and regular rhythm.  Pulmonary:     Effort: Pulmonary effort is normal. No respiratory distress.     Breath sounds: Normal breath sounds. No wheezing or rales.  Abdominal:     General: Bowel sounds are normal. There is no distension.     Palpations: Abdomen is soft.     Tenderness: There is no abdominal tenderness. There is no rebound.  Musculoskeletal:     Cervical back: Normal range of motion.  Skin:    General: Skin is warm and dry.  Neurological:     Mental Status: She is alert and oriented to person, place, and time.     Coordination: Coordination normal.     Vitals:   09/11/22 0821 09/11/22 0824  BP: (!) 144/80 (!) 144/80  Pulse: (!)  118   Temp: 98.6 F (37 C)   TempSrc: Oral   SpO2: 97%   Weight: 255 lb (115.7 kg)   Height: 5\' 4"  (1.626 m)     Assessment & Plan:

## 2022-09-11 NOTE — Patient Instructions (Addendum)
We have sent in the protonix to take daily in the morning for 1 week. If this helps some take 2 weeks then stop. If no improvement in 1 week okay to stop.  We will get the ultrasound to check the gallbladder.   Check on zepbound or wegovy to see if they are covered.

## 2022-09-11 NOTE — Assessment & Plan Note (Signed)
Prior low levels. Checking vitamin D and treat as appropriate.

## 2022-09-11 NOTE — Assessment & Plan Note (Signed)
Concerns about fatigue lately. Checking TSH, B12, vitamin D and HGA1c to address as appropriate.

## 2022-09-12 ENCOUNTER — Other Ambulatory Visit: Payer: Self-pay | Admitting: Internal Medicine

## 2022-09-12 MED ORDER — VITAMIN D (ERGOCALCIFEROL) 1.25 MG (50000 UNIT) PO CAPS: 50000.0000 [IU] | ORAL_CAPSULE | ORAL | 3 refills | Status: AC

## 2022-09-29 ENCOUNTER — Ambulatory Visit
Admission: RE | Admit: 2022-09-29 | Discharge: 2022-09-29 | Disposition: A | Discharge status: Home / Self Care | Payer: No Typology Code available for payment source | Source: Ambulatory Visit | Attending: Internal Medicine | Admitting: Internal Medicine

## 2022-09-29 ENCOUNTER — Encounter: Payer: Self-pay | Admitting: Internal Medicine

## 2022-09-29 DIAGNOSIS — R1011 Right upper quadrant pain: Secondary | ICD-10-CM

## 2022-09-29 NOTE — Telephone Encounter (Signed)
I have responded back to the gallbladder question in result notes. Please answer for weight medication question

## 2022-10-06 ENCOUNTER — Telehealth: Payer: Self-pay

## 2022-10-06 ENCOUNTER — Other Ambulatory Visit (HOSPITAL_COMMUNITY): Payer: Self-pay

## 2022-10-06 NOTE — Telephone Encounter (Signed)
Pharmacy Patient Advocate Encounter   Received notification from Patient Advice Request messages that prior authorization for [facility] 0.25MG /0.5ML auto-injectors is required/requested.   Insurance verification completed.   The patient is insured through [employer] [facility] [employer] .   Per test claim: PA required; PA submitted to [facility] via CoverMyMeds Key/confirmation #/EOC  [facility] Status is pending

## 2022-10-07 NOTE — Telephone Encounter (Signed)
Pharmacy Patient Advocate Encounter  Received notification from [facility] that Prior Authorization for [facility] 0.25MG /0.5ML auto-injectors  has been DENIED.  Full denial letter will be uploaded to the media tab. See denial reason below.   PA #/Case ID/Reference #:  [id number]   DENIAL REASON: Plan exclusion

## 2022-10-23 MED ORDER — METFORMIN HCL 500 MG PO TABS: 500.0000 mg | ORAL_TABLET | Freq: Every day | ORAL | 3 refills | Status: AC

## 2023-01-12 ENCOUNTER — Ambulatory Visit
Admission: EM | Admit: 2023-01-12 | Discharge: 2023-01-12 | Disposition: A | Discharge status: Home / Self Care | Payer: No Typology Code available for payment source | Attending: Family Medicine | Admitting: Family Medicine

## 2023-01-12 DIAGNOSIS — R52 Pain, unspecified: Secondary | ICD-10-CM | POA: Diagnosis present

## 2023-01-12 DIAGNOSIS — R350 Frequency of micturition: Secondary | ICD-10-CM | POA: Insufficient documentation

## 2023-01-12 LAB — POCT URINE PREGNANCY: Preg Test, Ur: NEGATIVE

## 2023-01-12 LAB — POCT URINALYSIS DIP (MANUAL ENTRY)
Bilirubin, UA: NEGATIVE
Glucose, UA: NEGATIVE mg/dL
Ketones, POC UA: NEGATIVE mg/dL
Leukocytes, UA: NEGATIVE
Nitrite, UA: NEGATIVE
Protein Ur, POC: >=300 mg/dL — AB
Spec Grav, UA: >=1.03 — AB (ref 1.010–1.025)
Urobilinogen, UA: 0.2 U/dL
pH, UA: 6 (ref 5.0–8.0)

## 2023-01-12 MED ORDER — CEPHALEXIN 500 MG PO CAPS: 500.0000 mg | ORAL_CAPSULE | Freq: Three times a day (TID) | ORAL | 0 refills | Status: AC

## 2023-01-12 NOTE — Discharge Instructions (Signed)
 Start Keflex  3 times a day for 1 week.  The clinical contact you with results of the urine culture done today if positive.  Increase your fluid intake and rest.  Please follow-up with your PCP in 2 days for recheck.  Please go to the ER for any worsening symptoms.  I hope you feel better soon!

## 2023-01-12 NOTE — ED Provider Notes (Signed)
 [facility]    CSN: [id number] Arrival date & time: 01/12/23  1446      History   Chief Complaint No chief complaint on file.   HPI Angela Crosby is a 43 y.o. female presents for evaluation of urinary frequency and bodyaches.  Patient reports 5 days of frequent urination without burning, urgency, hematuria.  Endorses some bodyaches, fatigue as well as intermittent dizziness primarily with position change.  No fevers, syncope, chest pain, shortness of breath, headaches nausea/vomiting, flank pain.  No vaginal discharge or STD concern.  No cough, sore throat, URI symptoms.  No OTC medications have been used for the symptoms.  No other concerns at this time.  HPI  History reviewed. No pertinent past medical history.  Patient Active Problem List   Diagnosis Date Noted   Other fatigue 09/11/2022   Vitamin D  deficiency 09/11/2022   RUQ pain 06/08/2020   Morbid obesity (HCC) 12/23/2013   Routine general medical examination at a health care facility 12/23/2013    History reviewed. No pertinent surgical history.  OB History   No obstetric history on file.      Home Medications    Prior to Admission medications   Medication Sig Start Date End Date Taking? Authorizing Provider  cephALEXin  (KEFLEX ) 500 MG capsule Take 1 capsule (500 mg total) by mouth 3 (three) times daily for 7 days. 01/12/23 01/19/23 Yes Oaklen Thiam, Jodi R, NP  Vitamin D , Ergocalciferol , (DRISDOL ) 1.25 MG (50000 UNIT) CAPS capsule Take 1 capsule (50,000 Units total) by mouth every 7 (seven) days. 09/12/22   Rollene Almarie LABOR, MD  albuterol  (VENTOLIN  HFA) 108 (90 Base) MCG/ACT inhaler Inhale 2 puffs into the lungs every 6 (six) hours as needed for wheezing or shortness of breath. 09/11/22   Rollene Almarie LABOR, MD  meloxicam  (MOBIC ) 15 MG tablet Take 1 tablet (15 mg total) by mouth daily. 10/15/21   Rollene Almarie LABOR, MD  metFORMIN  (GLUCOPHAGE ) 500 MG tablet Take 1 tablet (500 mg total) by mouth daily with  breakfast. 10/23/22   Rollene Almarie LABOR, MD  pantoprazole  (PROTONIX ) 40 MG tablet Take 1 tablet (40 mg total) by mouth daily. 09/11/22   Rollene Almarie LABOR, MD    Family History Family History  Problem Relation Age of Onset   Hypertension Father    Diabetes Maternal Grandmother    Diabetes Paternal Grandfather     Social History Social History   Tobacco Use   Smoking status: Former    Types: Cigarettes   Smokeless tobacco: Never  Vaping Use   Vaping status: Never Used  Substance Use Topics   Alcohol use: Yes    Alcohol/week: 1.0 standard drink of alcohol    Types: 1 Standard drinks or equivalent per week    Comment: wine once a month   Drug use: No     Allergies   Patient has no known allergies.   Review of Systems Review of Systems  Genitourinary:  Positive for frequency.  Musculoskeletal:  Positive for myalgias.  Neurological:  Positive for dizziness.     Physical Exam Triage Vital Signs ED Triage Vitals  Encounter Vitals Group     BP 01/12/23 1611 137/89     Systolic BP Percentile --      Diastolic BP Percentile --      Pulse Rate 01/12/23 1611 95     Resp 01/12/23 1611 16     Temp 01/12/23 1611 98.1 F (36.7 C)     Temp Source 01/12/23  1611 Oral     SpO2 01/12/23 1611 97 %     Weight --      Height --      Head Circumference --      Peak Flow --      Pain Score 01/12/23 1609 2     Pain Loc --      Pain Education --      Exclude from Growth Chart --    No data found.  Updated Vital Signs BP 137/89 (BP Location: Left Arm)   Pulse 95   Temp 98.1 F (36.7 C) (Oral)   Resp 16   LMP 12/29/2022 (Exact Date)   SpO2 97%   Visual Acuity Right Eye Distance:   Left Eye Distance:   Bilateral Distance:    Right Eye Near:   Left Eye Near:    Bilateral Near:     Physical Exam Vitals and nursing note reviewed.  Constitutional:      General: She is not in acute distress.    Appearance: Normal appearance. She is well-developed. She is not  ill-appearing.  HENT:     Head: Normocephalic and atraumatic.     Right Ear: Tympanic membrane and ear canal normal.     Left Ear: Tympanic membrane and ear canal normal.     Nose: No congestion or rhinorrhea.     Mouth/Throat:     Mouth: Mucous membranes are moist.     Pharynx: Oropharynx is clear. Uvula midline. No oropharyngeal exudate or posterior oropharyngeal erythema.     Tonsils: No tonsillar exudate or tonsillar abscesses.  Eyes:     Conjunctiva/sclera: Conjunctivae normal.     Pupils: Pupils are equal, round, and reactive to light.  Cardiovascular:     Rate and Rhythm: Normal rate and regular rhythm.     Heart sounds: Normal heart sounds.  Pulmonary:     Effort: Pulmonary effort is normal.     Breath sounds: Normal breath sounds.  Abdominal:     Tenderness: There is no right CVA tenderness or left CVA tenderness.  Musculoskeletal:     Cervical back: Normal range of motion and neck supple.  Lymphadenopathy:     Cervical: No cervical adenopathy.  Skin:    General: Skin is warm and dry.  Neurological:     General: No focal deficit present.     Mental Status: She is alert and oriented to person, place, and time.  Psychiatric:        Mood and Affect: Mood normal.        Behavior: Behavior normal.      UC Treatments / Results  Labs (all labs ordered are listed, but only abnormal results are displayed) Labs Reviewed  POCT URINALYSIS DIP (MANUAL ENTRY) - Abnormal; Notable for the following components:      Result Value   Clarity, UA cloudy (*)    Spec Grav, UA >=1.030 (*)    Blood, UA trace-lysed (*)    Protein Ur, POC >=300 (*)    All other components within normal limits  URINE CULTURE  POCT URINE PREGNANCY    EKG   Radiology No results found.  Procedures Procedures (including critical care time)  Medications Ordered in UC Medications - No data to display  Initial Impression / Assessment and Plan / UC Course  I have reviewed the triage vital signs  and the nursing notes.  Pertinent labs & imaging results that were available during my care of the patient were reviewed by  me and considered in my medical decision making (see chart for details).     Reviewed exam and symptoms with patient.  No red flags.  UA with trace blood and cloudy will culture.  Patient with body aches urinary frequency and fatigue without respiratory symptoms.  Will defer COVID and flu testing given length of symptoms.  Lungs CTA do not feel chest x-ray indicated at this time.  Will treat for potential UTI as cause of symptoms with Keflex  and send urine culture.  Advised to increase fluids and rest.  PCP follow-up 2 days for recheck.  ER precautions reviewed. Final Clinical Impressions(s) / UC Diagnoses   Final diagnoses:  Urinary frequency  Body aches     Discharge Instructions      Start Keflex  3 times a day for 1 week.  The clinical contact you with results of the urine culture done today if positive.  Increase your fluid intake and rest.  Please follow-up with your PCP in 2 days for recheck.  Please go to the ER for any worsening symptoms.  I hope you feel better soon!    ED Prescriptions     Medication Sig Dispense Auth. Provider   cephALEXin  (KEFLEX ) 500 MG capsule Take 1 capsule (500 mg total) by mouth 3 (three) times daily for 7 days. 21 capsule Kingsley Herandez, Jodi R, NP      PDMP not reviewed this encounter.   Loreda Myla SAUNDERS, NP 01/12/23 [phone]

## 2023-01-12 NOTE — ED Triage Notes (Addendum)
 Pt presents to UC for c/o bodyaches, fatigue, frequent urination x5 days. Reports feeling dizziness yesterday.

## 2023-01-13 LAB — URINE CULTURE: Culture: >=100000 — AB

## 2023-03-30 ENCOUNTER — Encounter: Payer: No Typology Code available for payment source | Admitting: Obstetrics

## 2023-04-09 ENCOUNTER — Ambulatory Visit: Admitting: Obstetrics and Gynecology

## 2023-04-09 ENCOUNTER — Other Ambulatory Visit (HOSPITAL_COMMUNITY)
Admission: RE | Admit: 2023-04-09 | Discharge: 2023-04-09 | Disposition: A | Discharge status: Home / Self Care | Source: Ambulatory Visit | Attending: Obstetrics and Gynecology | Admitting: Obstetrics and Gynecology

## 2023-04-09 ENCOUNTER — Encounter: Payer: Self-pay | Admitting: Obstetrics and Gynecology

## 2023-04-09 VITALS — BP 136/87 | HR 97 | Ht 64.0 in | Wt 273.0 lb

## 2023-04-09 DIAGNOSIS — Z113 Encounter for screening for infections with a predominantly sexual mode of transmission: Secondary | ICD-10-CM | POA: Diagnosis not present

## 2023-04-09 DIAGNOSIS — Z01419 Encounter for gynecological examination (general) (routine) without abnormal findings: Secondary | ICD-10-CM | POA: Insufficient documentation

## 2023-04-09 DIAGNOSIS — Z1231 Encounter for screening mammogram for malignant neoplasm of breast: Secondary | ICD-10-CM

## 2023-04-09 DIAGNOSIS — Z6841 Body Mass Index (BMI) 40.0 and over, adult: Secondary | ICD-10-CM | POA: Diagnosis not present

## 2023-04-09 NOTE — Progress Notes (Signed)
 GYNECOLOGY ANNUAL PREVENTATIVE CARE ENCOUNTER NOTE  History:     Angela Crosby is a 43 y.o. [id number] female here for a routine annual gynecologic exam.  Current complaints: none.   Denies abnormal vaginal bleeding, discharge, pelvic pain, problems with intercourse or other gynecologic concerns.   Pt has been doing well.  No acute complaints.  She notes regular menses that last 5 days.   Gynecologic History Patient's last menstrual period was 03/31/2023. Contraception: none Last Pap: never.  Last mammogram: never, will schedule  Obstetric History OB History  Gravida Para Term Preterm AB Living  1    1   SAB IAB Ectopic Multiple Live Births          # Outcome Date GA Lbr Len/2nd Weight Sex Type Anes PTL Lv  1 AB             Past Medical History:  Diagnosis Date   Fatty liver disease, nonalcoholic    Gall stones     History reviewed. No pertinent surgical history.  Current Outpatient Medications on File Prior to Visit  Medication Sig Dispense Refill   albuterol (VENTOLIN HFA) 108 (90 Base) MCG/ACT inhaler Inhale 2 puffs into the lungs every 6 (six) hours as needed for wheezing or shortness of breath. 8 g 2   Vitamin D, Ergocalciferol, (DRISDOL) 1.25 MG (50000 UNIT) CAPS capsule Take 1 capsule (50,000 Units total) by mouth every 7 (seven) days. 12 capsule 3   metFORMIN (GLUCOPHAGE) 500 MG tablet Take 1 tablet (500 mg total) by mouth daily with breakfast. 90 tablet 3   pantoprazole (PROTONIX) 40 MG tablet Take 1 tablet (40 mg total) by mouth daily. 30 tablet 0   No current facility-administered medications on file prior to visit.    No Known Allergies  Social History:  reports that she has quit smoking. Her smoking use included cigarettes. She has never used smokeless tobacco. She reports current alcohol use of about 1.0 standard drink of alcohol per week. She reports that she does not use drugs.  Family History  Problem Relation Age of Onset   Hypertension Father     Diabetes Maternal Grandmother    Diabetes Paternal Grandfather     The following portions of the patient's history were reviewed and updated as appropriate: allergies, current medications, past family history, past medical history, past social history, past surgical history and problem list.  Review of Systems Pertinent items noted in HPI and remainder of comprehensive ROS otherwise negative.  Physical Exam:  BP 136/87   Pulse 97   Ht 5\' 4"  (1.626 m)   Wt 273 lb (123.8 kg)   LMP 03/31/2023   BMI 46.86 kg/m  CONSTITUTIONAL: Well-developed, obese, well-nourished female in no acute distress.  HENT:  Normocephalic, atraumatic, External right and left ear normal. Oropharynx is clear and moist EYES: Conjunctivae and EOM are normal. NECK: Normal range of motion, supple, no masses.  Normal thyroid. Moderate acanthosis nigricans noted on posterior neck. SKIN: Skin is warm and dry. No rash noted. Not diaphoretic. No erythema. No pallor. MUSCULOSKELETAL: Normal range of motion. No tenderness.  No cyanosis, clubbing, or edema.  2+ distal pulses. NEUROLOGIC: Alert and oriented to person, place, and time. Normal reflexes, muscle tone coordination.  PSYCHIATRIC: Normal mood and affect. Normal behavior. Normal judgment and thought content. CARDIOVASCULAR: Normal heart rate noted, regular rhythm RESPIRATORY: Clear to auscultation bilaterally. Effort and breath sounds normal, no problems with respiration noted. BREASTS: Symmetric in size. No masses, tenderness,  skin changes, nipple drainage, or lymphadenopathy bilaterally. Performed in the presence of a chaperone. ABDOMEN: Soft, no distention noted.  No tenderness, rebound or guarding.  PELVIC: Normal appearing external genitalia and urethral meatus; normal appearing vaginal mucosa and cervix.  No abnormal discharge noted.  Pap smear obtained.  Vaginal swab taken.  Normal uterine size, no other palpable masses, no uterine or adnexal tenderness.  Performed  in the presence of a chaperone.   Assessment and Plan:    1. Women's annual routine gynecological examination (Primary) Normal annual exam Mammogram scheduled. Discussed external signs of insulin resistance and need for weight loss.  She will speak to PCP, but already knows her insurance will not pay for GLP-1 medications. Pt advised to watch blood pressure as she almost meets HTN criteria. - Cytology - PAP( [place]) - MM 3D SCREENING MAMMOGRAM BILATERAL BREAST; Future  2. Routine screening for STI (sexually transmitted infection) Per pt request - Cervicovaginal ancillary only( [place]) - HIV antibody (with reflex) - Hepatitis C Antibody - Hepatitis B Surface AntiGEN - RPR  3. Visit for screening mammogram  - MM 3D SCREENING MAMMOGRAM BILATERAL BREAST; Future  4. BMI 45.0-49.9, adult [facility])   Will follow up results of pap smear and manage accordingly. Mammogram scheduled Routine preventative health maintenance measures emphasized. Please refer to After Visit Summary for other counseling recommendations.      Mariel Aloe, MD, FACOG Obstetrician & Gynecologist, [facility] for [facility], [facility]

## 2023-04-09 NOTE — Progress Notes (Signed)
 New patient to office, here for routine exam.  Pt states she has never had a pap smear.

## 2023-04-10 ENCOUNTER — Encounter: Payer: Self-pay | Admitting: Internal Medicine

## 2023-04-10 ENCOUNTER — Other Ambulatory Visit: Payer: Self-pay | Admitting: Obstetrics and Gynecology

## 2023-04-10 ENCOUNTER — Encounter: Payer: Self-pay | Admitting: Obstetrics and Gynecology

## 2023-04-10 DIAGNOSIS — A5901 Trichomonal vulvovaginitis: Secondary | ICD-10-CM

## 2023-04-10 LAB — CERVICOVAGINAL ANCILLARY ONLY
Bacterial Vaginitis (gardnerella): NEGATIVE
Candida Glabrata: NEGATIVE
Candida Vaginitis: NEGATIVE
Chlamydia: NEGATIVE
Comment: NEGATIVE
Comment: NEGATIVE
Comment: NEGATIVE
Comment: NEGATIVE
Comment: NEGATIVE
Comment: NORMAL
Neisseria Gonorrhea: NEGATIVE
Trichomonas: POSITIVE — AB

## 2023-04-10 LAB — HIV ANTIBODY (ROUTINE TESTING W REFLEX): HIV Screen 4th Generation wRfx: NONREACTIVE

## 2023-04-10 LAB — HEPATITIS C ANTIBODY: Hep C Virus Ab: NONREACTIVE

## 2023-04-10 LAB — HEPATITIS B SURFACE ANTIGEN: Hepatitis B Surface Ag: NEGATIVE

## 2023-04-10 LAB — RPR: RPR Ser Ql: NONREACTIVE

## 2023-04-10 MED ORDER — METRONIDAZOLE 500 MG PO TABS: 500.0000 mg | ORAL_TABLET | Freq: Two times a day (BID) | ORAL | 0 refills | Status: AC

## 2023-04-13 LAB — CYTOLOGY - PAP
Comment: NEGATIVE
Diagnosis: NEGATIVE
High risk HPV: NEGATIVE

## 2023-04-14 ENCOUNTER — Encounter: Payer: Self-pay | Admitting: Obstetrics and Gynecology

## 2023-04-21 ENCOUNTER — Ambulatory Visit
Admission: RE | Admit: 2023-04-21 | Discharge: 2023-04-21 | Disposition: A | Discharge status: Home / Self Care | Source: Ambulatory Visit | Attending: Obstetrics and Gynecology | Admitting: Obstetrics and Gynecology

## 2023-04-21 DIAGNOSIS — Z01419 Encounter for gynecological examination (general) (routine) without abnormal findings: Secondary | ICD-10-CM

## 2023-04-21 DIAGNOSIS — Z1231 Encounter for screening mammogram for malignant neoplasm of breast: Secondary | ICD-10-CM

## 2023-04-24 ENCOUNTER — Other Ambulatory Visit: Payer: Self-pay | Admitting: Obstetrics and Gynecology

## 2023-04-24 DIAGNOSIS — R928 Other abnormal and inconclusive findings on diagnostic imaging of breast: Secondary | ICD-10-CM

## 2023-04-30 ENCOUNTER — Ambulatory Visit
Admission: RE | Admit: 2023-04-30 | Discharge: 2023-04-30 | Discharge status: Home / Self Care | Source: Ambulatory Visit | Attending: Obstetrics and Gynecology

## 2023-04-30 ENCOUNTER — Ambulatory Visit
Admission: RE | Admit: 2023-04-30 | Discharge: 2023-04-30 | Disposition: A | Discharge status: Home / Self Care | Source: Ambulatory Visit | Attending: Obstetrics and Gynecology | Admitting: Obstetrics and Gynecology

## 2023-04-30 ENCOUNTER — Other Ambulatory Visit: Payer: Self-pay | Admitting: Obstetrics and Gynecology

## 2023-04-30 DIAGNOSIS — R928 Other abnormal and inconclusive findings on diagnostic imaging of breast: Secondary | ICD-10-CM

## 2023-04-30 DIAGNOSIS — N6489 Other specified disorders of breast: Secondary | ICD-10-CM

## 2023-05-13 ENCOUNTER — Encounter

## 2023-05-13 ENCOUNTER — Other Ambulatory Visit

## 2023-08-04 IMAGING — CT CT HEAD W/O CM
3 series · 15 of 46 positions shown, 18 images · non-contrast
Comparison: None.

CLINICAL DATA: Headache, new or worsening, neuro deficit (Age
18-49y)

EXAM:
CT HEAD WITHOUT CONTRAST
TECHNIQUE: Contiguous axial images were obtained from the base of the skull
through the vertex without intravenous contrast.

[Series 2: head wo · axial · 0.39mm/px · z∈[+1009,+1129]mm · 9 of 29 slices shown, 12 images]
[im 3/29  brain]
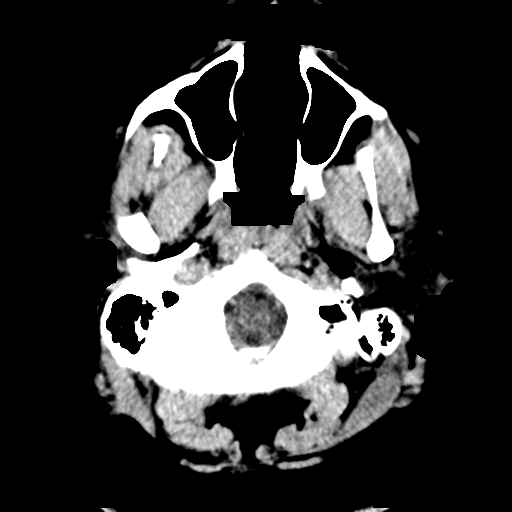
[im 3/29  bone]
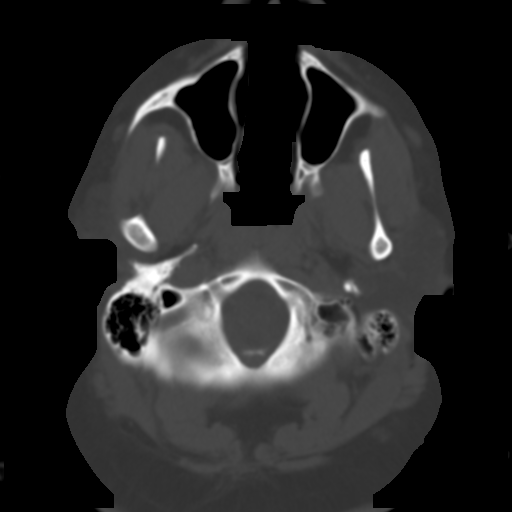
[im 6/29  brain]
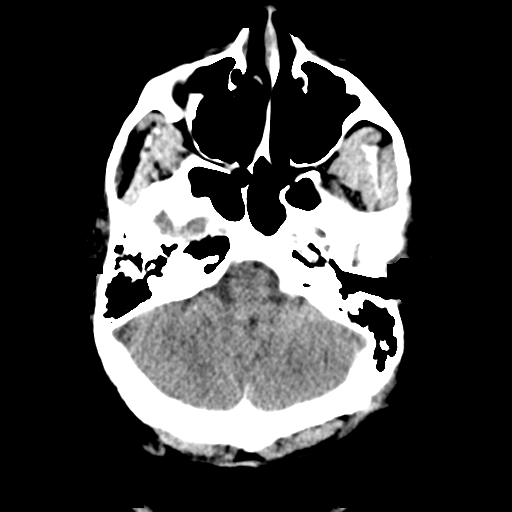
[im 9/29  brain]
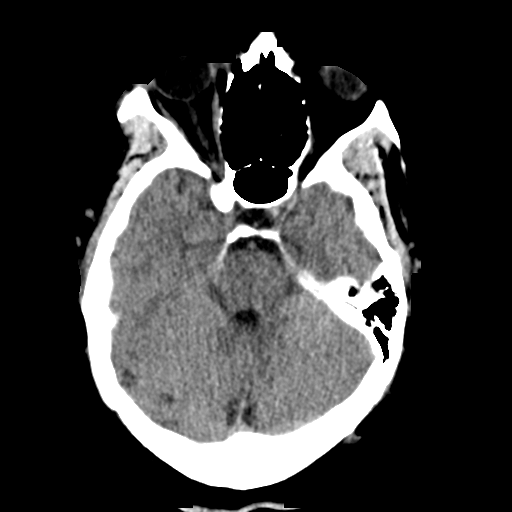
[im 12/29  brain]
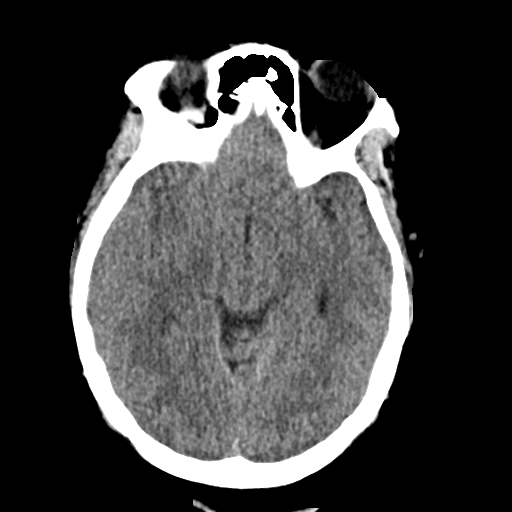
[im 15/29  brain]
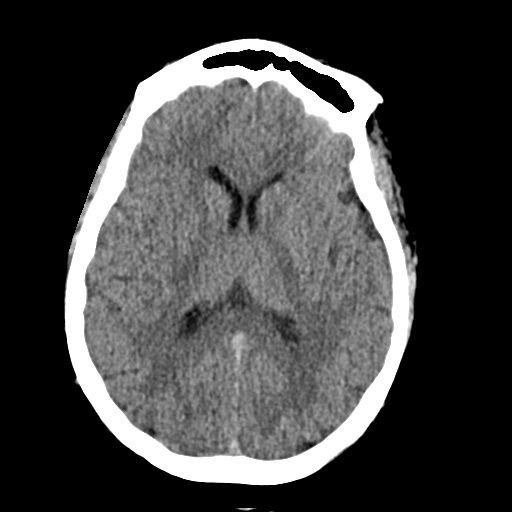
[im 15/29  bone]
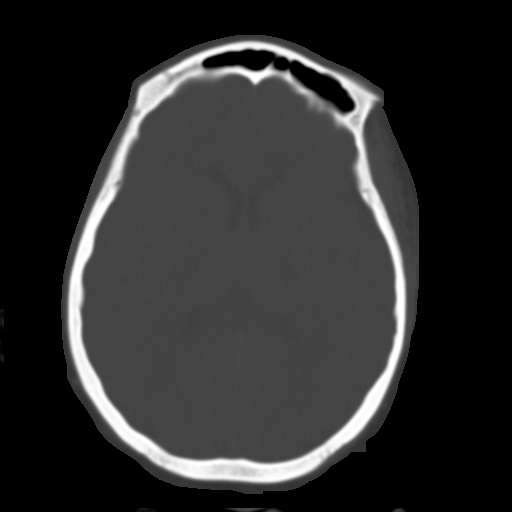
[im 18/29  brain]
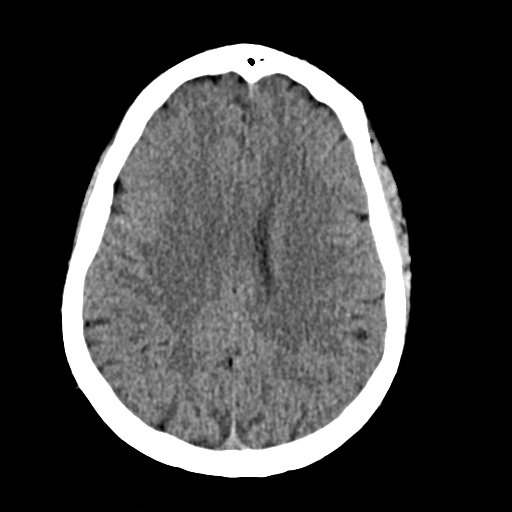
[im 21/29  brain]
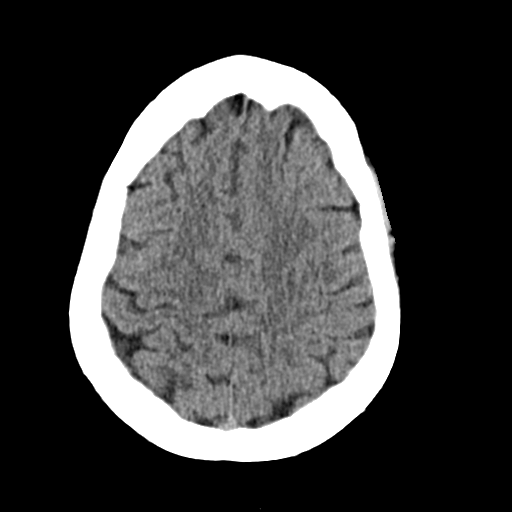
[im 24/29  brain]
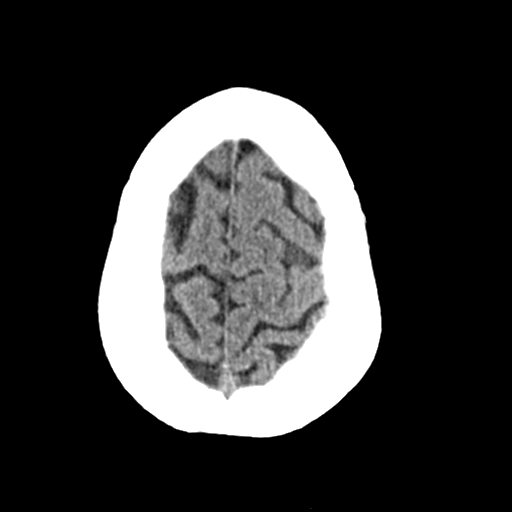
[im 27/29  brain]
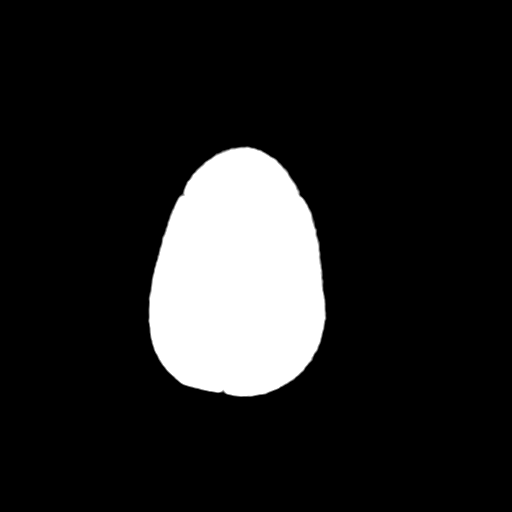
[im 27/29  bone]
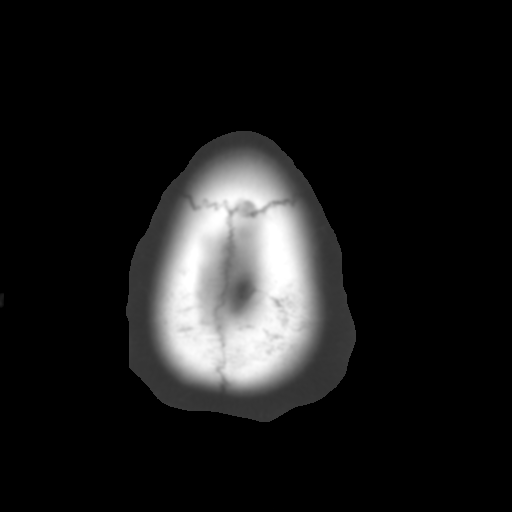

[Series 4: coronal soft · coronal · 0.30mm/px · 3 of 70 slices shown]
[im 24/70  brain]
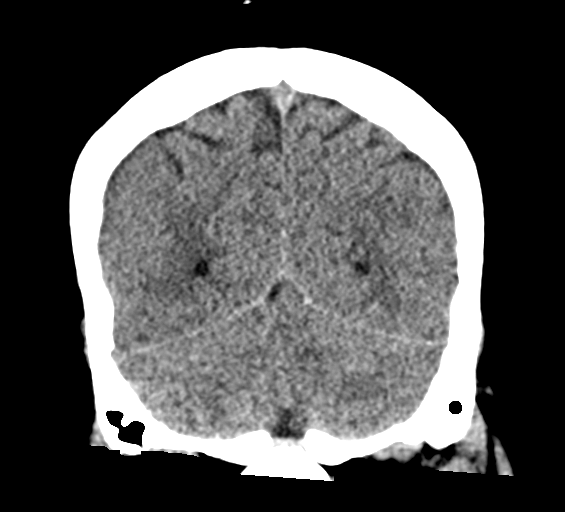
[im 31/70  brain]
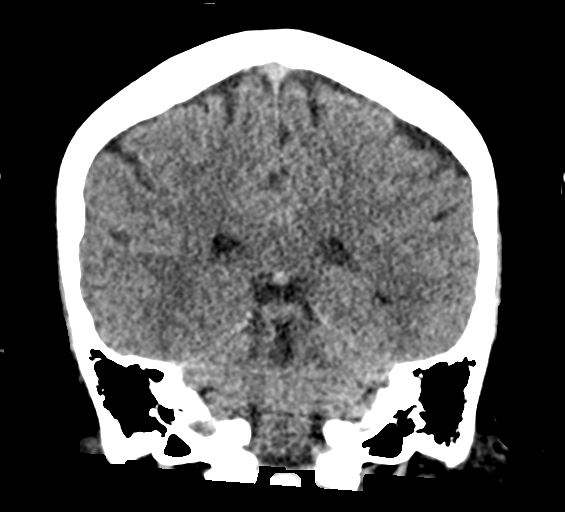
[im 39/70  brain]
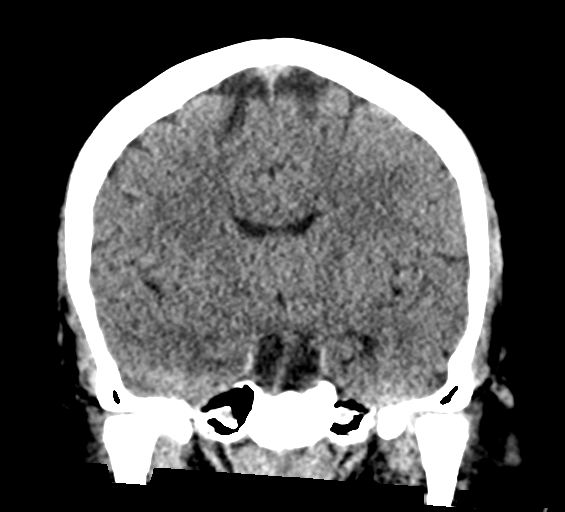

[Series 5: sag soft · sagittal · 0.30mm/px · 3 of 51 slices shown]
[im 17/51  brain]
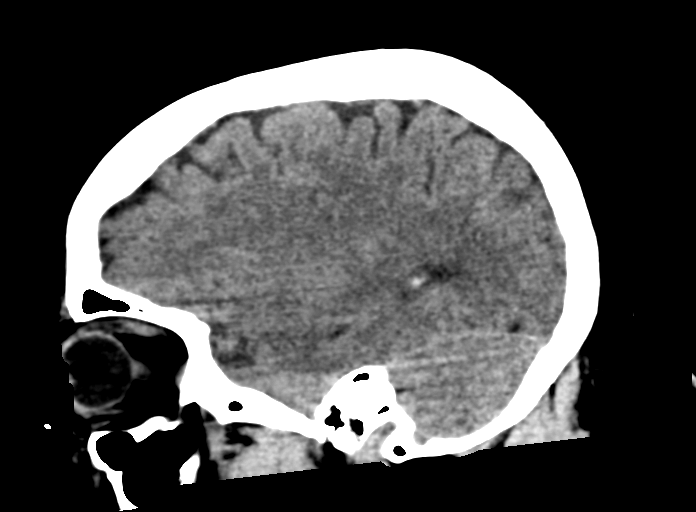
[im 26/51  brain]
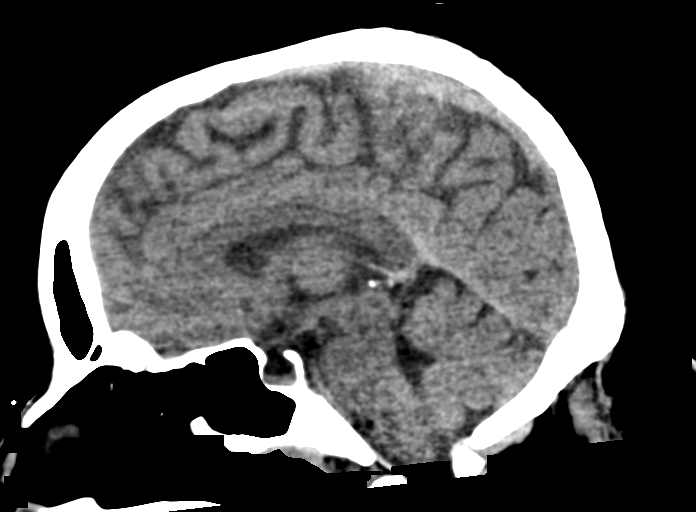
[im 34/51  brain]
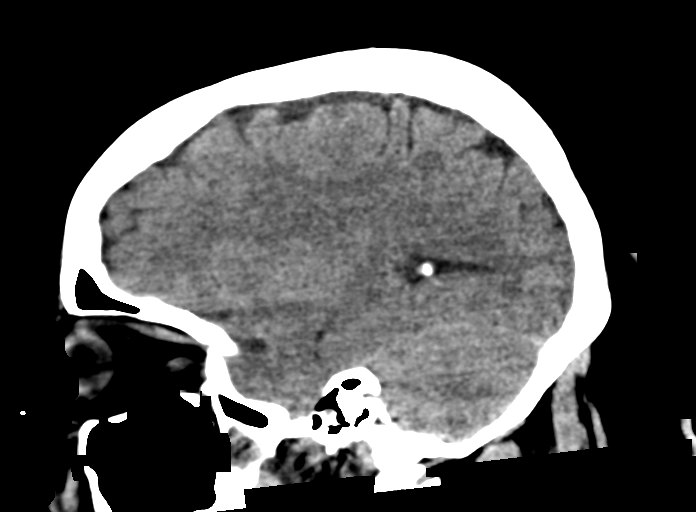

[15 of 46 positions shown; findings below may reference images not displayed]

FINDINGS: Brain:

No evidence of large-territorial acute infarction. No parenchymal
hemorrhage. No mass lesion. No extra-axial collection.

No mass effect or midline shift. No hydrocephalus. Basilar cisterns
are patent.

Vascular: No hyperdense vessel.

Skull: No acute fracture or focal lesion.

Sinuses/Orbits: Paranasal sinuses and mastoid air cells are clear.
The orbits are unremarkable.

Other: None.
IMPRESSION: No acute intracranial abnormality.

## 2023-11-02 ENCOUNTER — Encounter

## 2023-11-20 ENCOUNTER — Encounter

## 2023-11-25 ENCOUNTER — Other Ambulatory Visit: Payer: Self-pay | Admitting: Obstetrics and Gynecology

## 2023-11-25 ENCOUNTER — Ambulatory Visit
Admission: RE | Admit: 2023-11-25 | Discharge: 2023-11-25 | Disposition: A | Discharge status: Home / Self Care | Source: Ambulatory Visit | Attending: Obstetrics and Gynecology | Admitting: Obstetrics and Gynecology

## 2023-11-25 ENCOUNTER — Ambulatory Visit
Admission: RE | Admit: 2023-11-25 | Discharge: 2023-11-25 | Disposition: A | Source: Ambulatory Visit | Attending: Obstetrics and Gynecology

## 2023-11-25 DIAGNOSIS — N6489 Other specified disorders of breast: Secondary | ICD-10-CM

## 2023-11-26 ENCOUNTER — Ambulatory Visit: Payer: Self-pay | Admitting: Obstetrics and Gynecology

## 2024-05-25 ENCOUNTER — Other Ambulatory Visit

## 2024-05-25 ENCOUNTER — Encounter
